# Patient Record
Sex: Male | Born: 1995 | Race: White | Hispanic: No | Marital: Single | Smoking: Never smoker
Health system: Southern US, Community
[De-identification: ages and names within clinical notes are randomized; demographics above are authoritative.]

## PROBLEM LIST (undated history)

## (undated) DIAGNOSIS — R278 Other lack of coordination: Secondary | ICD-10-CM

## (undated) DIAGNOSIS — F909 Attention-deficit hyperactivity disorder, unspecified type: Secondary | ICD-10-CM

## (undated) DIAGNOSIS — J309 Allergic rhinitis, unspecified: Secondary | ICD-10-CM

## (undated) DIAGNOSIS — H9325 Central auditory processing disorder: Secondary | ICD-10-CM

## (undated) DIAGNOSIS — F329 Major depressive disorder, single episode, unspecified: Secondary | ICD-10-CM

## (undated) DIAGNOSIS — F32A Depression, unspecified: Secondary | ICD-10-CM

## (undated) DIAGNOSIS — R7303 Prediabetes: Secondary | ICD-10-CM

## (undated) HISTORY — DX: Central auditory processing disorder: H93.25

## (undated) HISTORY — DX: Other lack of coordination: R27.8

## (undated) HISTORY — DX: Attention-deficit hyperactivity disorder, unspecified type: F90.9

## (undated) HISTORY — DX: Morbid (severe) obesity due to excess calories: E66.01

## (undated) HISTORY — DX: Major depressive disorder, single episode, unspecified: F32.9

## (undated) HISTORY — PX: NO PAST SURGERIES: SHX2092

## (undated) HISTORY — DX: Depression, unspecified: F32.A

## (undated) HISTORY — DX: Allergic rhinitis, unspecified: J30.9

## (undated) HISTORY — DX: Prediabetes: R73.03

## (undated) HISTORY — PX: WISDOM TOOTH EXTRACTION: SHX21

---
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
## 2015-03-07 ENCOUNTER — Ambulatory Visit (INDEPENDENT_AMBULATORY_CARE_PROVIDER_SITE_OTHER): Payer: BC Managed Care – PPO

## 2015-03-07 DIAGNOSIS — Z23 Encounter for immunization: Secondary | ICD-10-CM | POA: Diagnosis not present

## 2015-03-10 ENCOUNTER — Ambulatory Visit: Payer: BC Managed Care – PPO

## 2015-04-11 ENCOUNTER — Ambulatory Visit (INDEPENDENT_AMBULATORY_CARE_PROVIDER_SITE_OTHER): Payer: BC Managed Care – PPO | Admitting: Internal Medicine

## 2015-04-11 ENCOUNTER — Encounter: Payer: Self-pay | Admitting: Internal Medicine

## 2015-04-11 VITALS — BP 119/84 | HR 74 | Temp 98.6°F | Resp 16 | Ht 74.0 in | Wt 257.0 lb

## 2015-04-11 DIAGNOSIS — J302 Other seasonal allergic rhinitis: Secondary | ICD-10-CM | POA: Diagnosis not present

## 2015-04-11 DIAGNOSIS — H6980 Other specified disorders of Eustachian tube, unspecified ear: Secondary | ICD-10-CM | POA: Insufficient documentation

## 2015-04-11 DIAGNOSIS — H6982 Other specified disorders of Eustachian tube, left ear: Secondary | ICD-10-CM | POA: Diagnosis not present

## 2015-04-11 MED ORDER — METHYLPREDNISOLONE 4 MG PO TBPK: ORAL_TABLET | ORAL | Status: DC

## 2015-04-11 MED ORDER — MOMETASONE FUROATE 50 MCG/ACT NA SUSP: 4.0000 | Freq: Every day | NASAL | Status: DC

## 2015-04-11 MED ORDER — CETIRIZINE-PSEUDOEPHEDRINE ER 5-120 MG PO TB12: 1.0000 | ORAL_TABLET | Freq: Two times a day (BID) | ORAL | Status: DC

## 2015-04-11 NOTE — Patient Instructions (Signed)

## 2015-04-11 NOTE — Progress Notes (Signed)
Subjective:  Patient ID: Ethan Cruz, male    DOB: 07-28-1995  Age: 19 y.o. MRN: [id number]  CC: Allergic Rhinitis    HPI Ethan Cruz presents for one-week history of upper respiratory symptoms. He complains of runny nose, sneezing, and a popping sensation in his left ear with a few episodes of dizziness. He has been using Alka-Seltzer without much relief from his symptoms.  Outpatient Prescriptions Prior to Visit  Medication Sig Dispense Refill  . escitalopram (LEXAPRO) 20 MG tablet Take 1 tablet (20 mg total) by mouth daily. 90 tablet 3  . Fluticasone Propionate (FLONASE ALLERGY RELIEF NA) Place into the nose daily.    . Loratadine-Pseudoephedrine (CLARITIN-D 24 HOUR PO) Take by mouth daily.    . nortriptyline (PAMELOR) 10 MG capsule Take 1 capsule (10 mg total) by mouth at bedtime. 30 capsule 5  . SUMAtriptan-naproxen (TREXIMET) 85-500 MG per tablet Take 1 tablet by mouth every 2 (two) hours as needed for migraine. 10 tablet 3   No facility-administered medications prior to visit.    ROS Review of Systems  Constitutional: Negative.  Negative for chills, diaphoresis, activity change, appetite change, fatigue and unexpected weight change.  HENT: Positive for congestion, postnasal drip, rhinorrhea and sneezing. Negative for nosebleeds, sinus pressure, sore throat, trouble swallowing and voice change.   Eyes: Negative.   Respiratory: Negative.  Negative for cough, choking, chest tightness, shortness of breath and stridor.   Cardiovascular: Negative.  Negative for chest pain, palpitations and leg swelling.  Gastrointestinal: Negative.  Negative for abdominal pain.  Endocrine: Negative.   Genitourinary: Negative.   Musculoskeletal: Negative.   Skin: Negative.  Negative for rash.  Allergic/Immunologic: Negative.   Neurological: Positive for dizziness.  Hematological: Negative.   Psychiatric/Behavioral: Negative.     Objective:  BP 119/84 mmHg  Pulse 74  Temp(Src) 98.6 F (37  C) (Oral)  Resp 16  Ht 6\' 2"  (1.88 m)  Wt 257 lb (116.574 kg)  BMI 32.98 kg/m2  SpO2 97%  BP Readings from Last 3 Encounters:  04/11/15 119/84  11/01/14 118/70  05/19/14 124/80    Wt Readings from Last 3 Encounters:  04/11/15 257 lb (116.574 kg) (99 %*, Z = 2.52)  11/01/14 248 lb (112.492 kg) (99 %*, Z = 2.40)  05/19/14 247 lb (112.038 kg) (99 %*, Z = 2.40)   * Growth percentiles are based on CDC 2-20 Years data.    Physical Exam  Constitutional: He is oriented to person, place, and time.  Non-toxic appearance. He does not have a sickly appearance. He does not appear ill. No distress.  HENT:  Head: Normocephalic and atraumatic.  Right Ear: Hearing, tympanic membrane, external ear and ear canal normal.  Left Ear: Hearing, tympanic membrane, external ear and ear canal normal.  Nose: Mucosal edema and rhinorrhea present. No sinus tenderness. Right sinus exhibits no maxillary sinus tenderness and no frontal sinus tenderness. Left sinus exhibits no maxillary sinus tenderness and no frontal sinus tenderness.  Mouth/Throat: Oropharynx is clear and moist and mucous membranes are normal. Mucous membranes are not pale, not dry and not cyanotic. No oral lesions. No trismus in the jaw. No uvula swelling. No oropharyngeal exudate, posterior oropharyngeal edema, posterior oropharyngeal erythema or tonsillar abscesses.  Eyes: Conjunctivae are normal. Right eye exhibits no discharge. Left eye exhibits no discharge. No scleral icterus.  Neck: Normal range of motion. Neck supple. No JVD present. No tracheal deviation present. No thyromegaly present.  Cardiovascular: Normal rate, regular rhythm, normal heart  sounds and intact distal pulses.  Exam reveals no gallop and no friction rub.   No murmur heard. Pulmonary/Chest: Effort normal and breath sounds normal. No stridor. No respiratory distress. He has no wheezes. He has no rales. He exhibits no tenderness.  Abdominal: Soft. Bowel sounds are normal.  He exhibits no distension and no mass. There is no tenderness. There is no rebound and no guarding.  Musculoskeletal: Normal range of motion. He exhibits no edema or tenderness.  Lymphadenopathy:    He has no cervical adenopathy.  Neurological: He is oriented to person, place, and time.  Skin: Skin is warm and dry. No rash noted. He is not diaphoretic. No erythema. No pallor.  Vitals reviewed.   Lab Results  Component Value Date   WBC 5.8 11/01/2014   HGB 15.1 11/01/2014   HCT 44.2 11/01/2014   PLT 207.0 11/01/2014   GLUCOSE 94 11/01/2014   CHOL 164 05/19/2014   TRIG 113.0 05/19/2014   HDL 28.30* 05/19/2014   LDLDIRECT 87.9 11/30/2010   LDLCALC 113* 05/19/2014   ALT 40 11/01/2014   AST 23 11/01/2014   NA 141 11/01/2014   K 3.8 11/01/2014   CL 104 11/01/2014   CREATININE 0.98 11/01/2014   BUN 13 11/01/2014   CO2 30 11/01/2014   TSH 1.41 11/01/2014   HGBA1C 5.6 02/10/2013    Dg Chest 2 View  02/26/2011  *RADIOLOGY REPORT* Clinical Data: 2 weeks of persistent coughing.  Itching in the chest. CHEST - 2 VIEW Comparison: None. Findings: Cardiomediastinal silhouette is within normal limits. The lungs are free of focal consolidations and pleural effusions. Bony structures have a normal appearance. IMPRESSION: Negative exam. Original Report Authenticated By: Patterson Hammersmith, M.D.   Assessment & Plan:   Ethan Cruz was seen today for allergic rhinitis .  Diagnoses and all orders for this visit:  Other seasonal allergic rhinitis- he is having a flareup of the symptoms and has not been using the previously prescribed medications. Will treat this with a Medrol Dosepak and will restart a steroid nasal spray as well as an oral antihistamine decongestant combination. -     methylPREDNISolone (MEDROL DOSEPAK) 4 MG TBPK tablet; TAKE AS DIRECTED -     mometasone (NASONEX) 50 MCG/ACT nasal spray; Place 4 sprays into the nose daily. -     cetirizine-pseudoephedrine (ZYRTEC-D ALLERGY &  CONGESTION) 5-120 MG tablet; Take 1 tablet by mouth 2 (two) times daily.  Eustachian tube dysfunction, left- will treat this with a Medrol Dosepak and will start Nasonex nasal spray and antihistamines decongestants. -     methylPREDNISolone (MEDROL DOSEPAK) 4 MG TBPK tablet; TAKE AS DIRECTED -     mometasone (NASONEX) 50 MCG/ACT nasal spray; Place 4 sprays into the nose daily. -     cetirizine-pseudoephedrine (ZYRTEC-D ALLERGY & CONGESTION) 5-120 MG tablet; Take 1 tablet by mouth 2 (two) times daily.   I have discontinued Mr. Dunshee Loratadine-Pseudoephedrine (CLARITIN-D 24 HOUR PO), Fluticasone Propionate (FLONASE ALLERGY RELIEF NA), nortriptyline, and SUMAtriptan-naproxen. I am also having him start on methylPREDNISolone, mometasone, and cetirizine-pseudoephedrine. Additionally, I am having him maintain his escitalopram.  Meds ordered this encounter  Medications  . methylPREDNISolone (MEDROL DOSEPAK) 4 MG TBPK tablet    Sig: TAKE AS DIRECTED    Dispense:  21 tablet    Refill:  0  . mometasone (NASONEX) 50 MCG/ACT nasal spray    Sig: Place 4 sprays into the nose daily.    Dispense:  17 g    Refill:  12  . cetirizine-pseudoephedrine (ZYRTEC-D ALLERGY & CONGESTION) 5-120 MG tablet    Sig: Take 1 tablet by mouth 2 (two) times daily.    Dispense:  60 tablet    Refill:  11     Follow-up: Return in about 3 weeks (around 05/02/2015).  Sanda Linger, MD

## 2015-04-11 NOTE — Progress Notes (Signed)
Pre visit review using our clinic review tool, if applicable. No additional management support is needed unless otherwise documented below in the visit note. 

## 2015-07-11 ENCOUNTER — Ambulatory Visit (INDEPENDENT_AMBULATORY_CARE_PROVIDER_SITE_OTHER)
Admission: RE | Admit: 2015-07-11 | Discharge: 2015-07-11 | Disposition: A | Discharge status: Home / Self Care | Payer: BC Managed Care – PPO | Source: Ambulatory Visit | Attending: Internal Medicine | Admitting: Internal Medicine

## 2015-07-11 ENCOUNTER — Other Ambulatory Visit (INDEPENDENT_AMBULATORY_CARE_PROVIDER_SITE_OTHER): Payer: BC Managed Care – PPO

## 2015-07-11 ENCOUNTER — Encounter: Payer: Self-pay | Admitting: Internal Medicine

## 2015-07-11 ENCOUNTER — Ambulatory Visit (INDEPENDENT_AMBULATORY_CARE_PROVIDER_SITE_OTHER): Payer: BC Managed Care – PPO | Admitting: Internal Medicine

## 2015-07-11 VITALS — BP 112/82 | HR 78 | Temp 98.6°F | Resp 16 | Ht 74.0 in | Wt 265.0 lb

## 2015-07-11 DIAGNOSIS — M791 Myalgia: Secondary | ICD-10-CM

## 2015-07-11 DIAGNOSIS — M25561 Pain in right knee: Secondary | ICD-10-CM

## 2015-07-11 DIAGNOSIS — IMO0001 Reserved for inherently not codable concepts without codable children: Secondary | ICD-10-CM

## 2015-07-11 DIAGNOSIS — M609 Myositis, unspecified: Secondary | ICD-10-CM

## 2015-07-11 DIAGNOSIS — M25569 Pain in unspecified knee: Secondary | ICD-10-CM | POA: Insufficient documentation

## 2015-07-11 LAB — CBC WITH DIFFERENTIAL/PLATELET
Basophils Absolute: 0 10*3/uL (ref 0.0–0.1)
Basophils Relative: 0.4 % (ref 0.0–3.0)
EOS PCT: 3.8 % (ref 0.0–5.0)
Eosinophils Absolute: 0.4 10*3/uL (ref 0.0–0.7)
HCT: 48.5 % (ref 36.0–49.0)
HEMOGLOBIN: 16.2 g/dL — AB (ref 12.0–16.0)
Lymphocytes Relative: 26.2 % (ref 24.0–48.0)
Lymphs Abs: 2.9 10*3/uL (ref 0.7–4.0)
MCHC: 33.3 g/dL (ref 31.0–37.0)
MCV: 85.9 fl (ref 78.0–98.0)
MONOS PCT: 6.9 % (ref 3.0–12.0)
Monocytes Absolute: 0.8 10*3/uL (ref 0.1–1.0)
Neutro Abs: 6.8 10*3/uL (ref 1.4–7.7)
Neutrophils Relative %: 62.7 % (ref 43.0–71.0)
Platelets: 256 10*3/uL (ref 150.0–575.0)
RBC: 5.65 Mil/uL (ref 3.80–5.70)
RDW: 13.6 % (ref 11.4–15.5)
WBC: 10.9 10*3/uL (ref 4.5–13.5)

## 2015-07-11 LAB — COMPREHENSIVE METABOLIC PANEL
ALBUMIN: 4.8 g/dL (ref 3.5–5.2)
ALT: 44 U/L (ref 0–53)
AST: 20 U/L (ref 0–37)
Alkaline Phosphatase: 77 U/L (ref 52–171)
BUN: 20 mg/dL (ref 6–23)
CALCIUM: 9.3 mg/dL (ref 8.4–10.5)
CHLORIDE: 105 meq/L (ref 96–112)
CO2: 26 mEq/L (ref 19–32)
Creatinine, Ser: 1.22 mg/dL (ref 0.40–1.50)
GFR: 80.89 mL/min (ref >60.00)
Glucose, Bld: 100 mg/dL — ABNORMAL HIGH (ref 70–99)
POTASSIUM: 3.9 meq/L (ref 3.5–5.1)
SODIUM: 140 meq/L (ref 135–145)
Total Bilirubin: 0.4 mg/dL (ref 0.2–1.2)
Total Protein: 7.6 g/dL (ref 6.0–8.3)

## 2015-07-11 LAB — RHEUMATOID FACTOR: Rhuematoid fact SerPl-aCnc: <10 IU/mL (ref <=14)

## 2015-07-11 LAB — CK: CK TOTAL: 88 U/L (ref 7–232)

## 2015-07-11 NOTE — Patient Instructions (Signed)
Muscle Pain, Adult  Muscle pain (myalgia) may be caused by many things, including:  · Overuse or muscle strain, especially if you are not in shape. This is the most common cause of muscle pain.  · Injury.  · Bruises.  · Viruses, such as the flu.  · Infectious diseases.  · Fibromyalgia, which is a chronic condition that causes muscle tenderness, fatigue, and headache.  · Autoimmune diseases, including lupus.  · Certain drugs, including ACE inhibitors and statins.  Muscle pain may be mild or severe. In most cases, the pain lasts only a short time and goes away without treatment. To diagnose the cause of your muscle pain, your health care provider will take your medical history. This means he or she will ask you when your muscle pain began and what has been happening. If you have not had muscle pain for very long, your health care provider may want to wait before doing much testing. If your muscle pain has lasted a long time, your health care provider may want to run tests right away. If your health care provider thinks your muscle pain may be caused by illness, you may need to have additional tests to rule out certain conditions.   Treatment for muscle pain depends on the cause. Home care is often enough to relieve muscle pain. Your health care provider may also prescribe anti-inflammatory medicine.  HOME CARE INSTRUCTIONS  Watch your condition for any changes. The following actions may help to lessen any discomfort you are feeling:  · Only take over-the-counter or prescription medicines as directed by your health care provider.  · Apply ice to the sore muscle:    Put ice in a plastic bag.    Place a towel between your skin and the bag.    Leave the ice on for 15-20 minutes, 3-4 times a day.  · You may alternate applying hot and cold packs to the muscle as directed by your health care provider.  · If overuse is causing your muscle pain, slow down your activities until the pain goes away.    Remember that it is normal  to feel some muscle pain after starting a workout program. Muscles that have not been used often will be sore at first.    Do regular, gentle exercises if you are not usually active.    Warm up before exercising to lower your risk of muscle pain.  · Do not continue working out if the pain is very bad. Bad pain could mean you have injured a muscle.  SEEK MEDICAL CARE IF:  · Your muscle pain gets worse, and medicines do not help.  · You have muscle pain that lasts longer than 3 days.  · You have a rash or fever along with muscle pain.  · You have muscle pain after a tick bite.  · You have muscle pain while working out, even though you are in good physical condition.  · You have redness, soreness, or swelling along with muscle pain.  · You have muscle pain after starting a new medicine or changing the dose of a medicine.  SEEK IMMEDIATE MEDICAL CARE IF:  · You have trouble breathing.  · You have trouble swallowing.  · You have muscle pain along with a stiff neck, fever, and vomiting.  · You have severe muscle weakness or cannot move part of your body.  MAKE SURE YOU:   · Understand these instructions.  · Will watch your condition.  · Will get   help right away if you are not doing well or get worse.     This information is not intended to replace advice given to you by your health care provider. Make sure you discuss any questions you have with your health care provider.     Document Released: 04/18/2006 Document Revised: 06/17/2014 Document Reviewed: 03/23/2013  [employer] Interactive Patient Education ©2016 [employer]

## 2015-07-12 ENCOUNTER — Encounter: Payer: Self-pay | Admitting: Internal Medicine

## 2015-07-12 LAB — CYCLIC CITRUL PEPTIDE ANTIBODY, IGG

## 2015-07-12 LAB — ANA: Anti Nuclear Antibody(ANA): NEGATIVE

## 2015-07-12 LAB — SEDIMENTATION RATE: SED RATE: 4 mm/h (ref 0–22)

## 2015-07-13 NOTE — Progress Notes (Signed)
Subjective:  Patient ID: Ethan Cruz, male    DOB: [birth year]  Age: 20 y.o. MRN: [id number]  CC: Knee Pain and Muscle Pain   HPI Ethan Cruz presents for the complaint of burning and aching in his muscles and large joints for about a week or 2. The symptoms are symmetrical. He also complains of a mild headache but denies nausea or vomiting. He has not taken anything for the discomfort. The symptoms are most severe in his right knee.  Outpatient Prescriptions Prior to Visit  Medication Sig Dispense Refill  . cetirizine-pseudoephedrine (ZYRTEC-D ALLERGY & CONGESTION) 5-120 MG tablet Take 1 tablet by mouth 2 (two) times daily. 60 tablet 11  . escitalopram (LEXAPRO) 20 MG tablet Take 1 tablet (20 mg total) by mouth daily. 90 tablet 3  . mometasone (NASONEX) 50 MCG/ACT nasal spray Place 4 sprays into the nose daily. 17 g 12  . methylPREDNISolone (MEDROL DOSEPAK) 4 MG TBPK tablet TAKE AS DIRECTED 21 tablet 0   No facility-administered medications prior to visit.    ROS Review of Systems  Constitutional: Negative.  Negative for fever, chills, diaphoresis, appetite change and fatigue.  HENT: Negative for facial swelling and sore throat.   Eyes: Negative.   Respiratory: Negative.  Negative for cough, choking, chest tightness, shortness of breath and stridor.   Cardiovascular: Negative.  Negative for chest pain, palpitations and leg swelling.  Gastrointestinal: Negative.  Negative for nausea, vomiting, abdominal pain, diarrhea, constipation and blood in stool.  Endocrine: Negative.   Genitourinary: Negative.  Negative for dysuria, urgency, hematuria, difficulty urinating and genital sores.  Musculoskeletal: Positive for myalgias and arthralgias. Negative for back pain, joint swelling, neck pain and neck stiffness.  Skin: Negative.  Negative for color change, pallor and rash.  Allergic/Immunologic: Negative.   Neurological: Positive for headaches. Negative for dizziness, tremors, weakness  and numbness.  Hematological: Negative.  Negative for adenopathy. Does not bruise/bleed easily.  Psychiatric/Behavioral: Negative.     Objective:  BP 112/82 mmHg  Pulse 78  Temp(Src) 98.6 F (37 C) (Oral)  Resp 16  Ht  (1.88 m)  Wt 265 lb (120.203 kg)  BMI 34.01 kg/m2  SpO2 97%  BP Readings from Last 3 Encounters:  07/11/15 112/82  04/11/15 119/84  11/01/14 118/70    Wt Readings from Last 3 Encounters:  07/11/15 265 lb (120.203 kg) (100 %*, Z = 2.63)  04/11/15 257 lb (116.574 kg) (99 %*, Z = 2.52)  11/01/14 248 lb (112.492 kg) (99 %*, Z = 2.40)   * Growth percentiles are based on CDC 2-20 Years data.    Physical Exam  Constitutional: He is oriented to person, place, and time. He appears well-developed and well-nourished.  Non-toxic appearance. He does not have a sickly appearance. He does not appear ill. No distress.  HENT:  Head: Normocephalic and atraumatic.  Mouth/Throat: Oropharynx is clear and moist. No oropharyngeal exudate.  Eyes: Conjunctivae are normal. Right eye exhibits no discharge. Left eye exhibits no discharge. No scleral icterus.  Neck: Normal range of motion. Neck supple. No JVD present. No tracheal deviation present. No thyromegaly present.  Cardiovascular: Normal rate, regular rhythm, normal heart sounds and intact distal pulses.  Exam reveals no gallop and no friction rub.   No murmur heard. Pulmonary/Chest: Effort normal and breath sounds normal. No stridor. No respiratory distress. He has no wheezes. He has no rales. He exhibits no tenderness.  Abdominal: Soft. Bowel sounds are normal. He exhibits no distension and no  mass. There is no tenderness. There is no rebound and no guarding.  Musculoskeletal: Normal range of motion. He exhibits no edema or tenderness.       Right knee: He exhibits deformity. He exhibits normal range of motion, no swelling, no effusion, no ecchymosis and no erythema. No tenderness found.       Legs: All of his joints show  free range of motion with no tenderness to palpation, erythema, swelling, or effusions.  Lymphadenopathy:    He has no cervical adenopathy.  Neurological: He is oriented to person, place, and time.  Skin: Skin is warm and dry. No rash noted. He is not diaphoretic. No erythema. No pallor.  Psychiatric: He has a normal mood and affect. His behavior is normal. Judgment and thought content normal.  Vitals reviewed.   Lab Results  Component Value Date   WBC 10.9 07/11/2015   HGB 16.2* 07/11/2015   HCT 48.5 07/11/2015   PLT 256.0 07/11/2015   GLUCOSE 100* 07/11/2015   CHOL 164 05/19/2014   TRIG 113.0 05/19/2014   HDL 28.30* 05/19/2014   LDLDIRECT 87.9 11/30/2010   LDLCALC 113* 05/19/2014   ALT 44 07/11/2015   AST 20 07/11/2015   NA 140 07/11/2015   K 3.9 07/11/2015   CL 105 07/11/2015   CREATININE 1.22 07/11/2015   BUN 20 07/11/2015   CO2 26 07/11/2015   TSH 1.41 11/01/2014   HGBA1C 5.6 02/10/2013    Dg Knee Complete 4 Views Right  07/12/2015  CLINICAL DATA:  Right knee nodule.  Pain.  No known injury. EXAM: RIGHT KNEE - COMPLETE 4+ VIEW COMPARISON:  No prior . FINDINGS: No acute bony or joint abnormality identified. No evidence of fracture dislocation. Soft tissues are unremarkable. If symptoms persist MRI can be obtained. IMPRESSION: Negative exam. Electronically Signed   By: Maisie Fus  Register   On: 07/12/2015 07:55    Assessment & Plan:   Beckham was seen today for knee pain and muscle pain.  Diagnoses and all orders for this visit:  Acute knee pain, right- exam and x-ray are normal. -     DG Knee Complete 4 Views Right; Future  Myalgia and myositis- his exam is normal, all of his labs are within normal limits, there is no evidence of inflammation, rheumatic disease, infection, anemia, or abnormal electrolytes. This appears to be a viral syndrome. He will start taking over-the-counter doses of Advil for symptom relief. -     Comprehensive metabolic panel; Future -     CK;  Future -     CBC with Differential/Platelet; Future -     Sedimentation rate; Future -     ANA; Future -     Cyclic citrul peptide antibody, IgG; Future -     Rheumatoid factor; Future   I have discontinued Mr. Vane's methylPREDNISolone. I am also having him maintain his escitalopram, mometasone, and cetirizine-pseudoephedrine.  No orders of the defined types were placed in this encounter.     Follow-up: Return in about 4 weeks (around 08/08/2015).  Sanda Linger, MD

## 2015-09-05 ENCOUNTER — Encounter: Payer: Self-pay | Admitting: Family Medicine

## 2015-09-05 ENCOUNTER — Ambulatory Visit (INDEPENDENT_AMBULATORY_CARE_PROVIDER_SITE_OTHER): Payer: BC Managed Care – PPO | Admitting: Family Medicine

## 2015-09-05 VITALS — BP 130/80 | Temp 98.9°F | Wt 267.0 lb

## 2015-09-05 DIAGNOSIS — J069 Acute upper respiratory infection, unspecified: Secondary | ICD-10-CM | POA: Diagnosis not present

## 2015-09-05 DIAGNOSIS — B9789 Other viral agents as the cause of diseases classified elsewhere: Principal | ICD-10-CM

## 2015-09-05 MED ORDER — HYDROCODONE-HOMATROPINE 5-1.5 MG/5ML PO SYRP: ORAL_SOLUTION | ORAL | Status: DC

## 2015-09-05 NOTE — Patient Instructions (Signed)
Drink lots of liquids  Tylenol or aspirin for general: Symptoms  Hydromet 1/2-1 teaspoon at bedtime when necessary for cough  See Dr. Yetta BarreJones if you have any concerns

## 2015-09-05 NOTE — Progress Notes (Signed)
Pre visit review using our clinic review tool, if applicable. No additional management support is needed unless otherwise documented below in the visit note. 

## 2015-09-05 NOTE — Progress Notes (Signed)
   Subjective:    Patient ID: Ethan Cruz, male    DOB: 1996/02/26, 20 y.o.   MRN: [id number]  HPI Ethan Cruz is a 20 year old single male nonsmoker who comes in today for evaluation of a cold for one day!!!!!!!!!!!!!!!!!!!!!!  He's had head congestion runny nose and slight cough 1 day no fever chills etc. etc. he called his primary care physician Dr. Yetta BarreJones however they declined to see him.   Review of Systems    review of systems negative Objective:   Physical Exam  Well-developed well-nourished male no acute distress vital signs stable he is afebrile HEENT were negative neck was supple no adenopathy lungs are clear      Assessment & Plan:  Viral syndrome treat symptomatically with Tylenol lots of liquids and Hydromet cough syrup when necessary for nighttime cough

## 2015-09-07 ENCOUNTER — Encounter: Payer: Self-pay | Admitting: Internal Medicine

## 2015-09-07 ENCOUNTER — Ambulatory Visit (INDEPENDENT_AMBULATORY_CARE_PROVIDER_SITE_OTHER)
Admission: RE | Admit: 2015-09-07 | Discharge: 2015-09-07 | Disposition: A | Discharge status: Home / Self Care | Payer: BC Managed Care – PPO | Source: Ambulatory Visit | Attending: Internal Medicine | Admitting: Internal Medicine

## 2015-09-07 ENCOUNTER — Ambulatory Visit (INDEPENDENT_AMBULATORY_CARE_PROVIDER_SITE_OTHER): Payer: BC Managed Care – PPO | Admitting: Internal Medicine

## 2015-09-07 VITALS — BP 132/102 | HR 76 | Temp 98.2°F | Resp 16 | Ht 74.02 in | Wt 267.0 lb

## 2015-09-07 DIAGNOSIS — I10 Essential (primary) hypertension: Secondary | ICD-10-CM | POA: Insufficient documentation

## 2015-09-07 DIAGNOSIS — J069 Acute upper respiratory infection, unspecified: Secondary | ICD-10-CM | POA: Diagnosis not present

## 2015-09-07 DIAGNOSIS — R05 Cough: Secondary | ICD-10-CM

## 2015-09-07 DIAGNOSIS — R059 Cough, unspecified: Secondary | ICD-10-CM

## 2015-09-07 DIAGNOSIS — B9789 Other viral agents as the cause of diseases classified elsewhere: Principal | ICD-10-CM

## 2015-09-07 MED ORDER — METHYLPREDNISOLONE 4 MG PO TBPK: ORAL_TABLET | ORAL | Status: DC

## 2015-09-07 MED ORDER — TELMISARTAN 40 MG PO TABS: 40.0000 mg | ORAL_TABLET | Freq: Every day | ORAL | Status: DC

## 2015-09-07 NOTE — Progress Notes (Signed)
Pre visit review using our clinic review tool, if applicable. No additional management support is needed unless otherwise documented below in the visit note. 

## 2015-09-07 NOTE — Progress Notes (Signed)
Subjective:  Patient ID: Ethan Cruz, male    DOB: Nov 15, 1995  Age: 20 y.o. MRN: [id number]  CC: Hypertension and Cough   HPI Ethan Cruz presents for a 3 day history of cough productive of clear mucus, sore throat, earache, and chest pain with coughing.  Outpatient Prescriptions Prior to Visit  Medication Sig Dispense Refill  . escitalopram (LEXAPRO) 20 MG tablet Take 1 tablet (20 mg total) by mouth daily. 90 tablet 3  . HYDROcodone-homatropine (HYCODAN) 5-1.5 MG/5ML syrup 1/2-1 teaspoon at bedtime when necessary for cough 120 mL 0  . mometasone (NASONEX) 50 MCG/ACT nasal spray Place 4 sprays into the nose daily. 17 g 12  . cetirizine-pseudoephedrine (ZYRTEC-D ALLERGY & CONGESTION) 5-120 MG tablet Take 1 tablet by mouth 2 (two) times daily. 60 tablet 11   No facility-administered medications prior to visit.    ROS Review of Systems  Constitutional: Negative.  Negative for fever, chills, diaphoresis, appetite change and fatigue.  HENT: Positive for ear pain and sore throat. Negative for congestion, rhinorrhea, sinus pressure, trouble swallowing and voice change.   Eyes: Negative.   Respiratory: Positive for cough. Negative for choking, chest tightness, shortness of breath, wheezing and stridor.   Cardiovascular: Positive for chest pain. Negative for palpitations and leg swelling.  Gastrointestinal: Negative.  Negative for nausea, vomiting, abdominal pain, diarrhea, constipation and blood in stool.  Endocrine: Negative.   Genitourinary: Negative.   Musculoskeletal: Negative.  Negative for myalgias, back pain, joint swelling, arthralgias and neck pain.  Skin: Negative.   Allergic/Immunologic: Negative.   Neurological: Negative.   Hematological: Negative.  Negative for adenopathy. Does not bruise/bleed easily.  Psychiatric/Behavioral: Negative.     Objective:  BP 132/102 mmHg  Pulse 76  Temp(Src) 98.2 F (36.8 C) (Oral)  Resp 16  Ht 6' 2.02" (1.88 m)  Wt 267 lb (121.11  kg)  BMI 34.27 kg/m2  BP Readings from Last 3 Encounters:  09/07/15 132/102  09/05/15 130/80  07/11/15 112/82    Wt Readings from Last 3 Encounters:  09/07/15 267 lb (121.11 kg) (100 %*, Z = 2.66)  09/05/15 267 lb (121.11 kg) (100 %*, Z = 2.66)  07/11/15 265 lb (120.203 kg) (100 %*, Z = 2.63)   * Growth percentiles are based on CDC 2-20 Years data.    Physical Exam  Constitutional: He is oriented to person, place, and time. He appears well-developed and well-nourished.  Non-toxic appearance. He does not have a sickly appearance. He does not appear ill. No distress.  HENT:  Head: Normocephalic and atraumatic.  Mouth/Throat: Oropharynx is clear and moist. No oropharyngeal exudate.  Eyes: Conjunctivae are normal. Right eye exhibits no discharge. Left eye exhibits no discharge. No scleral icterus.  Neck: Normal range of motion. Neck supple. No JVD present. No tracheal deviation present. No thyromegaly present.  Cardiovascular: Normal rate, regular rhythm, normal heart sounds and intact distal pulses.  Exam reveals no gallop and no friction rub.   No murmur heard. EKG ---  Sinus  Rhythm  -With rate variation  cv = 11. WITHIN NORMAL LIMITS  Pulmonary/Chest: Effort normal and breath sounds normal. No stridor. No respiratory distress. He has no wheezes. He has no rales. He exhibits no tenderness.  Abdominal: Soft. Bowel sounds are normal. He exhibits no distension and no mass. There is no tenderness. There is no rebound and no guarding.  Musculoskeletal: Normal range of motion. He exhibits no edema or tenderness.  Lymphadenopathy:    He has no cervical  adenopathy.  Neurological: He is oriented to person, place, and time.  Skin: Skin is warm and dry. No rash noted. He is not diaphoretic. No erythema. No pallor.  Vitals reviewed.   Lab Results  Component Value Date   WBC 10.9 07/11/2015   HGB 16.2* 07/11/2015   HCT 48.5 07/11/2015   PLT 256.0 07/11/2015   GLUCOSE 100* 07/11/2015     CHOL 164 05/19/2014   TRIG 113.0 05/19/2014   HDL 28.30* 05/19/2014   LDLDIRECT 87.9 11/30/2010   LDLCALC 113* 05/19/2014   ALT 44 07/11/2015   AST 20 07/11/2015   NA 140 07/11/2015   K 3.9 07/11/2015   CL 105 07/11/2015   CREATININE 1.22 07/11/2015   BUN 20 07/11/2015   CO2 26 07/11/2015   TSH 1.41 11/01/2014   HGBA1C 5.6 02/10/2013    Dg Knee Complete 4 Views Right  07/12/2015  CLINICAL DATA:  Right knee nodule.  Pain.  No known injury. EXAM: RIGHT KNEE - COMPLETE 4+ VIEW COMPARISON:  No prior . FINDINGS: No acute bony or joint abnormality identified. No evidence of fracture dislocation. Soft tissues are unremarkable. If symptoms persist MRI can be obtained. IMPRESSION: Negative exam. Electronically Signed   By: Maisie Fus  Register   On: 07/12/2015 07:55   Dg Chest 2 View  09/07/2015  CLINICAL DATA:  Cough and shortness of breath for 4 days. EXAM: CHEST  2 VIEW COMPARISON:  02/26/2011 FINDINGS: The heart size and mediastinal contours are within normal limits. Both lungs are clear. The visualized skeletal structures are unremarkable. IMPRESSION: Negative.  No active cardiopulmonary disease. Electronically Signed   By: Myles Rosenthal M.D.   On: 09/07/2015 14:13    Assessment & Plan:   Ethan Cruz was seen today for hypertension and cough.  Diagnoses and all orders for this visit:  Viral URI with cough- he will continue to cough suppressant and will also take a course of steroids for symptom relief. -     methylPREDNISolone (MEDROL DOSEPAK) 4 MG TBPK tablet; TAKE AS DIRECTED  Essential hypertension- his blood pressure is symmetrically elevated in both arms so I don't think he has coarctation of the aorta, also his EKG is normal today. He has a strong family history of hypertension in his recent labs do not show any secondary causes for hypertension. Will treat him with an ARB. -     EKG 12-Lead -     telmisartan (MICARDIS) 40 MG tablet; Take 1 tablet (40 mg total) by mouth daily.  Cough- his  chest x-ray is normal -     DG Chest 2 View; Future  Essential hypertension -     EKG 12-Lead -     telmisartan (MICARDIS) 40 MG tablet; Take 1 tablet (40 mg total) by mouth daily.  I have discontinued Mr. Ethan Cruz cetirizine-pseudoephedrine. I am also having him start on methylPREDNISolone and telmisartan. Additionally, I am having him maintain his escitalopram, mometasone, and HYDROcodone-homatropine.  Meds ordered this encounter  Medications  . methylPREDNISolone (MEDROL DOSEPAK) 4 MG TBPK tablet    Sig: TAKE AS DIRECTED    Dispense:  21 tablet    Refill:  0  . telmisartan (MICARDIS) 40 MG tablet    Sig: Take 1 tablet (40 mg total) by mouth daily.    Dispense:  30 tablet    Refill:  3     Follow-up: Return in about 3 weeks (around 09/28/2015).  Sanda Linger, MD

## 2015-09-07 NOTE — Patient Instructions (Signed)
Hypertension Hypertension, commonly called high blood pressure, is when the force of blood pumping through your arteries is too strong. Your arteries are the blood vessels that carry blood from your heart throughout your body. A blood pressure reading consists of a higher number over a lower number, such as 110/72. The higher number (systolic) is the pressure inside your arteries when your heart pumps. The lower number (diastolic) is the pressure inside your arteries when your heart relaxes. Ideally you want your blood pressure below 120/80. Hypertension forces your heart to work harder to pump blood. Your arteries may become narrow or stiff. Having untreated or uncontrolled hypertension can cause heart attack, stroke, kidney disease, and other problems. RISK FACTORS Some risk factors for high blood pressure are controllable. Others are not.  Risk factors you cannot control include:   Race. You may be at higher risk if you are African American.  Age. Risk increases with age.  Gender. Men are at higher risk than women before age 45 years. After age 65, women are at higher risk than men. Risk factors you can control include:  Not getting enough exercise or physical activity.  Being overweight.  Getting too much fat, sugar, calories, or salt in your diet.  Drinking too much alcohol. SIGNS AND SYMPTOMS Hypertension does not usually cause signs or symptoms. Extremely high blood pressure (hypertensive crisis) may cause headache, anxiety, shortness of breath, and nosebleed. DIAGNOSIS To check if you have hypertension, your health care provider will measure your blood pressure while you are seated, with your arm held at the level of your heart. It should be measured at least twice using the same arm. Certain conditions can cause a difference in blood pressure between your right and left arms. A blood pressure reading that is higher than normal on one occasion does not mean that you need treatment. If  it is not clear whether you have high blood pressure, you may be asked to return on a different day to have your blood pressure checked again. Or, you may be asked to monitor your blood pressure at home for 1 or more weeks. TREATMENT Treating high blood pressure includes making lifestyle changes and possibly taking medicine. Living a healthy lifestyle can help lower high blood pressure. You may need to change some of your habits. Lifestyle changes may include:  Following the DASH diet. This diet is high in fruits, vegetables, and whole grains. It is low in salt, red meat, and added sugars.  Keep your sodium intake below 2,300 mg per day.  Getting at least 30-45 minutes of aerobic exercise at least 4 times per week.  Losing weight if necessary.  Not smoking.  Limiting alcoholic beverages.  Learning ways to reduce stress. Your health care provider may prescribe medicine if lifestyle changes are not enough to get your blood pressure under control, and if one of the following is true:  You are 18-59 years of age and your systolic blood pressure is above 140.  You are 60 years of age or older, and your systolic blood pressure is above 150.  Your diastolic blood pressure is above 90.  You have diabetes, and your systolic blood pressure is over 140 or your diastolic blood pressure is over 90.  You have kidney disease and your blood pressure is above 140/90.  You have heart disease and your blood pressure is above 140/90. Your personal target blood pressure may vary depending on your medical conditions, your age, and other factors. HOME CARE INSTRUCTIONS    Have your blood pressure rechecked as directed by your health care provider.   Take medicines only as directed by your health care provider. Follow the directions carefully. Blood pressure medicines must be taken as prescribed. The medicine does not work as well when you skip doses. Skipping doses also puts you at risk for  problems.  Do not smoke.   Monitor your blood pressure at home as directed by your health care provider. SEEK MEDICAL CARE IF:   You think you are having a reaction to medicines taken.  You have recurrent headaches or feel dizzy.  You have swelling in your ankles.  You have trouble with your vision. SEEK IMMEDIATE MEDICAL CARE IF:  You develop a severe headache or confusion.  You have unusual weakness, numbness, or feel faint.  You have severe chest or abdominal pain.  You vomit repeatedly.  You have trouble breathing. MAKE SURE YOU:   Understand these instructions.  Will watch your condition.  Will get help right away if you are not doing well or get worse.   This information is not intended to replace advice given to you by your health care provider. Make sure you discuss any questions you have with your health care provider.   Document Released: 05/27/2005 Document Revised: 10/11/2014 Document Reviewed: 03/19/2013 Elsevier Interactive Patient Education 2016 [employer]  

## 2015-09-28 ENCOUNTER — Encounter: Payer: Self-pay | Admitting: Internal Medicine

## 2015-09-28 ENCOUNTER — Ambulatory Visit (INDEPENDENT_AMBULATORY_CARE_PROVIDER_SITE_OTHER): Payer: BC Managed Care – PPO | Admitting: Internal Medicine

## 2015-09-28 VITALS — BP 122/82 | HR 93 | Temp 98.1°F | Resp 16 | Ht 74.03 in | Wt 252.0 lb

## 2015-09-28 DIAGNOSIS — I10 Essential (primary) hypertension: Secondary | ICD-10-CM | POA: Diagnosis not present

## 2015-09-28 NOTE — Patient Instructions (Signed)
Hypertension Hypertension, commonly called high blood pressure, is when the force of blood pumping through your arteries is too strong. Your arteries are the blood vessels that carry blood from your heart throughout your body. A blood pressure reading consists of a higher number over a lower number, such as 110/72. The higher number (systolic) is the pressure inside your arteries when your heart pumps. The lower number (diastolic) is the pressure inside your arteries when your heart relaxes. Ideally you want your blood pressure below 120/80. Hypertension forces your heart to work harder to pump blood. Your arteries may become narrow or stiff. Having untreated or uncontrolled hypertension can cause heart attack, stroke, kidney disease, and other problems. RISK FACTORS Some risk factors for high blood pressure are controllable. Others are not.  Risk factors you cannot control include:   Race. You may be at higher risk if you are African American.  Age. Risk increases with age.  Gender. Men are at higher risk than women before age 45 years. After age 65, women are at higher risk than men. Risk factors you can control include:  Not getting enough exercise or physical activity.  Being overweight.  Getting too much fat, sugar, calories, or salt in your diet.  Drinking too much alcohol. SIGNS AND SYMPTOMS Hypertension does not usually cause signs or symptoms. Extremely high blood pressure (hypertensive crisis) may cause headache, anxiety, shortness of breath, and nosebleed. DIAGNOSIS To check if you have hypertension, your health care provider will measure your blood pressure while you are seated, with your arm held at the level of your heart. It should be measured at least twice using the same arm. Certain conditions can cause a difference in blood pressure between your right and left arms. A blood pressure reading that is higher than normal on one occasion does not mean that you need treatment. If  it is not clear whether you have high blood pressure, you may be asked to return on a different day to have your blood pressure checked again. Or, you may be asked to monitor your blood pressure at home for 1 or more weeks. TREATMENT Treating high blood pressure includes making lifestyle changes and possibly taking medicine. Living a healthy lifestyle can help lower high blood pressure. You may need to change some of your habits. Lifestyle changes may include:  Following the DASH diet. This diet is high in fruits, vegetables, and whole grains. It is low in salt, red meat, and added sugars.  Keep your sodium intake below 2,300 mg per day.  Getting at least 30-45 minutes of aerobic exercise at least 4 times per week.  Losing weight if necessary.  Not smoking.  Limiting alcoholic beverages.  Learning ways to reduce stress. Your health care provider may prescribe medicine if lifestyle changes are not enough to get your blood pressure under control, and if one of the following is true:  You are 18-59 years of age and your systolic blood pressure is above 140.  You are 60 years of age or older, and your systolic blood pressure is above 150.  Your diastolic blood pressure is above 90.  You have diabetes, and your systolic blood pressure is over 140 or your diastolic blood pressure is over 90.  You have kidney disease and your blood pressure is above 140/90.  You have heart disease and your blood pressure is above 140/90. Your personal target blood pressure may vary depending on your medical conditions, your age, and other factors. HOME CARE INSTRUCTIONS    Have your blood pressure rechecked as directed by your health care provider.   Take medicines only as directed by your health care provider. Follow the directions carefully. Blood pressure medicines must be taken as prescribed. The medicine does not work as well when you skip doses. Skipping doses also puts you at risk for  problems.  Do not smoke.   Monitor your blood pressure at home as directed by your health care provider. SEEK MEDICAL CARE IF:   You think you are having a reaction to medicines taken.  You have recurrent headaches or feel dizzy.  You have swelling in your ankles.  You have trouble with your vision. SEEK IMMEDIATE MEDICAL CARE IF:  You develop a severe headache or confusion.  You have unusual weakness, numbness, or feel faint.  You have severe chest or abdominal pain.  You vomit repeatedly.  You have trouble breathing. MAKE SURE YOU:   Understand these instructions.  Will watch your condition.  Will get help right away if you are not doing well or get worse.   This information is not intended to replace advice given to you by your health care provider. Make sure you discuss any questions you have with your health care provider.   Document Released: 05/27/2005 Document Revised: 10/11/2014 Document Reviewed: 03/19/2013 Elsevier Interactive Patient Education 2016 [employer]  

## 2015-09-28 NOTE — Progress Notes (Signed)
Pre visit review using our clinic review tool, if applicable. No additional management support is needed unless otherwise documented below in the visit note. 

## 2015-09-30 NOTE — Progress Notes (Signed)
Subjective:  Patient ID: Ethan Cruz, male    DOB: [birth year]  Age: 20 y.o. MRN: [id number]  CC: Hypertension   HPI Ethan Cruz presents for a blood pressure check. He feels well and offers no complaints and tells me his blood pressure has been well controlled. Since I last saw him he has lost weight with dietary restrictions and exercise.  Outpatient Prescriptions Prior to Visit  Medication Sig Dispense Refill  . escitalopram (LEXAPRO) 20 MG tablet Take 1 tablet (20 mg total) by mouth daily. 90 tablet 3  . mometasone (NASONEX) 50 MCG/ACT nasal spray Place 4 sprays into the nose daily. 17 g 12  . telmisartan (MICARDIS) 40 MG tablet Take 1 tablet (40 mg total) by mouth daily. 30 tablet 3  . HYDROcodone-homatropine (HYCODAN) 5-1.5 MG/5ML syrup 1/2-1 teaspoon at bedtime when necessary for cough 120 mL 0  . methylPREDNISolone (MEDROL DOSEPAK) 4 MG TBPK tablet TAKE AS DIRECTED 21 tablet 0   No facility-administered medications prior to visit.    ROS Review of Systems  Constitutional: Negative.   HENT: Negative.   Eyes: Negative.   Respiratory: Negative.  Negative for cough, choking, chest tightness, shortness of breath and stridor.   Cardiovascular: Negative.  Negative for chest pain, palpitations and leg swelling.  Gastrointestinal: Negative.  Negative for nausea, vomiting, abdominal pain, diarrhea and constipation.  Endocrine: Negative.   Genitourinary: Negative.  Negative for urgency, hematuria and difficulty urinating.  Musculoskeletal: Negative.   Skin: Negative.   Allergic/Immunologic: Negative.   Neurological: Negative.  Negative for dizziness, tremors, weakness and light-headedness.  Hematological: Negative.  Negative for adenopathy. Does not bruise/bleed easily.  Psychiatric/Behavioral: Negative.     Objective:  BP 122/82 mmHg  Pulse 93  Temp(Src) 98.1 F (36.7 C) (Oral)  Resp 16  Ht 6' 2.03" (1.88 m)  Wt 252 lb (114.306 kg)  BMI 32.34 kg/m2  SpO2 97%  BP  Readings from Last 3 Encounters:  09/28/15 122/82  09/07/15 132/102  09/05/15 130/80    Wt Readings from Last 3 Encounters:  09/28/15 252 lb (114.306 kg) (99 %*, Z = 2.43)  09/07/15 267 lb (121.11 kg) (100 %*, Z = 2.66)  09/05/15 267 lb (121.11 kg) (100 %*, Z = 2.66)   * Growth percentiles are based on CDC 2-20 Years data.    Physical Exam  Constitutional: He is oriented to person, place, and time. No distress.  HENT:  Mouth/Throat: Oropharynx is clear and moist. No oropharyngeal exudate.  Eyes: Conjunctivae are normal. Right eye exhibits no discharge. Left eye exhibits no discharge. No scleral icterus.  Neck: Normal range of motion. Neck supple. No JVD present. No tracheal deviation present. No thyromegaly present.  Cardiovascular: Normal rate, regular rhythm, normal heart sounds and intact distal pulses.  Exam reveals no gallop and no friction rub.   No murmur heard. Pulmonary/Chest: Effort normal and breath sounds normal. No stridor. No respiratory distress. He has no wheezes. He has no rales. He exhibits no tenderness.  Abdominal: Soft. Bowel sounds are normal. He exhibits no distension and no mass. There is no tenderness. There is no rebound and no guarding.  Musculoskeletal: Normal range of motion. He exhibits no edema or tenderness.  Lymphadenopathy:    He has no cervical adenopathy.  Neurological: He is oriented to person, place, and time.  Skin: Skin is warm and dry. No rash noted. He is not diaphoretic. No erythema. No pallor.  Vitals reviewed.   Lab Results  Component Value Date  WBC 10.9 07/11/2015   HGB 16.2* 07/11/2015   HCT 48.5 07/11/2015   PLT 256.0 07/11/2015   GLUCOSE 100* 07/11/2015   CHOL 164 05/19/2014   TRIG 113.0 05/19/2014   HDL 28.30* 05/19/2014   LDLDIRECT 87.9 11/30/2010   LDLCALC 113* 05/19/2014   ALT 44 07/11/2015   AST 20 07/11/2015   NA 140 07/11/2015   K 3.9 07/11/2015   CL 105 07/11/2015   CREATININE 1.22 07/11/2015   BUN 20  07/11/2015   CO2 26 07/11/2015   TSH 1.41 11/01/2014   HGBA1C 5.6 02/10/2013    Dg Chest 2 View  09/07/2015  CLINICAL DATA:  Cough and shortness of breath for 4 days. EXAM: CHEST  2 VIEW COMPARISON:  02/26/2011 FINDINGS: The heart size and mediastinal contours are within normal limits. Both lungs are clear. The visualized skeletal structures are unremarkable. IMPRESSION: Negative.  No active cardiopulmonary disease. Electronically Signed   By: Myles RosenthalJohn  Stahl M.D.   On: 09/07/2015 14:13    Assessment & Plan:   Ethan Cruz was seen today for hypertension.  Diagnoses and all orders for this visit:  Essential hypertension- his blood pressure is well-controlled, will continue the ARB.   I have discontinued Mr. Ethan Cruz's HYDROcodone-homatropine and methylPREDNISolone. I am also having him maintain his escitalopram, mometasone, and telmisartan.  No orders of the defined types were placed in this encounter.     Follow-up: Return in about 6 months (around 03/29/2016).  Sanda Lingerhomas Jones, MD

## 2015-10-26 ENCOUNTER — Encounter: Payer: Self-pay | Admitting: Internal Medicine

## 2015-12-15 ENCOUNTER — Other Ambulatory Visit: Payer: Self-pay | Admitting: Internal Medicine

## 2016-01-11 ENCOUNTER — Ambulatory Visit (INDEPENDENT_AMBULATORY_CARE_PROVIDER_SITE_OTHER): Payer: BC Managed Care – PPO | Admitting: Internal Medicine

## 2016-01-11 ENCOUNTER — Encounter: Payer: Self-pay | Admitting: Internal Medicine

## 2016-01-11 ENCOUNTER — Ambulatory Visit: Payer: BC Managed Care – PPO | Admitting: Internal Medicine

## 2016-01-11 DIAGNOSIS — R7303 Prediabetes: Secondary | ICD-10-CM

## 2016-01-11 DIAGNOSIS — R21 Rash and other nonspecific skin eruption: Secondary | ICD-10-CM | POA: Diagnosis not present

## 2016-01-11 DIAGNOSIS — I10 Essential (primary) hypertension: Secondary | ICD-10-CM | POA: Diagnosis not present

## 2016-01-11 MED ORDER — TRIAMCINOLONE ACETONIDE 0.1 % EX CREA: 1.0000 "application " | TOPICAL_CREAM | Freq: Two times a day (BID) | CUTANEOUS | 0 refills | Status: DC

## 2016-01-11 MED ORDER — PREDNISONE 10 MG PO TABS: ORAL_TABLET | ORAL | 0 refills | Status: DC

## 2016-01-11 NOTE — Patient Instructions (Signed)
Please take all new medication as prescribed - the prednisone, and the cream as needed  Please continue all other medications as before, and refills have been done if requested.  Please have the pharmacy call with any other refills you may need.  Please keep your appointments with your specialists as you may have planned

## 2016-01-11 NOTE — Assessment & Plan Note (Signed)
stable overall by history and exam, recent data reviewed with pt, and pt to continue medical treatment as before,  to f/u any worsening symptoms or concerns BP Readings from Last 3 Encounters:  01/11/16 106/70  09/28/15 122/82  09/07/15 (!) 132/102

## 2016-01-11 NOTE — Assessment & Plan Note (Signed)
Suspect c/w contact dermatitis, for predpac asd, triam cr prn, benadryl cream and/or benadryl 50 q 6 prn itching as well,  to f/u any worsening symptoms or concerns

## 2016-01-11 NOTE — Progress Notes (Signed)
Subjective:    Patient ID: Ethan Cruz, male    DOB: 1996/01/21, 20 y.o.   MRN: [id number]  HPI  Here with numerous itchy red spots to bilat LE's from mid thigh distal x 4 days, mild to mod itching, without swelling, lip or tongue swelling or wheezing, sob.  Pt denies chest pain, increased sob or doe, wheezing, orthopnea, PND, increased LE swelling, palpitations, dizziness or syncope.  Started the day after a trip to [place] walking in the park in shorts.  Nothing else makes better or worse.   Pt denies fever, wt loss, night sweats, loss of appetite, or other constitutional symptoms  Has not tried any OTC meds such as benadryl.  Has also used a mary kay lotion product to all extremities recently but has no rash other than legs. Past Medical History:  Diagnosis Date  . ADHD (attention deficit hyperactivity disorder)   . Allergic rhinitis, cause unspecified 09/19/2011  . Asthma 09/19/2011  . Auditory processing disorder   . Depression   . Dysgraphia   . Glucose intolerance (pre-diabetes) 2011  . Morbid obesity (HCC)    Past Surgical History:  Procedure Laterality Date  . NO PAST SURGERIES      reports that he has never smoked. He has never used smokeless tobacco. He reports that he does not drink alcohol or use drugs. family history includes Arthritis in his mother and other; Clotting disorder (age of onset: 57) in his mother; Diabetes in his mother and other; Heart disease in his other; Hypertension in his other; Ovarian cancer in his other; Stroke in his other. No Known Allergies Current Outpatient Prescriptions on File Prior to Visit  Medication Sig Dispense Refill  . escitalopram (LEXAPRO) 20 MG tablet TAKE 1 TABLET(20 MG) BY MOUTH DAILY 90 tablet 2  . telmisartan (MICARDIS) 40 MG tablet Take 1 tablet (40 mg total) by mouth daily. 30 tablet 3  . mometasone (NASONEX) 50 MCG/ACT nasal spray Place 4 sprays into the nose daily. (Patient not taking: Reported on 01/11/2016) 17 g 12    No current facility-administered medications on file prior to visit.    Review of Systems  Constitutional: Negative for unusual diaphoresis or night sweats HENT: Negative for ear swelling or discharge Eyes: Negative for worsening visual haziness  Respiratory: Negative for choking and stridor.   Gastrointestinal: Negative for distension or worsening eructation Genitourinary: Negative for retention or change in urine volume.  Musculoskeletal: Negative for other MSK pain or swelling Skin: Negative for color change and worsening wound Neurological: Negative for tremors and numbness other than noted  Psychiatric/Behavioral: Negative for decreased concentration or agitation other than above       Objective:   Physical Exam BP 106/70   Pulse 68   Temp 97.9 F (36.6 C)   Wt 250 lb (113.4 kg)   SpO2 98%   BMI 32.07 kg/m  VS noted,  Constitutional: Pt appears in no apparent distress HENT: Head: NCAT.  Right Ear: External ear normal.  Left Ear: External ear normal.  Eyes: . Pupils are equal, round, and reactive to light. Conjunctivae and EOM are normal Neck: Normal range of motion. Neck supple.  Cardiovascular: Normal rate and regular rhythm.   Pulmonary/Chest: Effort normal and breath sounds without rales or wheezing.  Abd:  Soft, NT, ND, + BS Neurological: Pt is alert. Not confused , motor grossly intact Skin: Skin is warm. no LE edema, + nontender TNTC small pruritic erythem lesions slightly raised 3-5 mm to  both legs from mid thigh to ankles only Psychiatric: Pt behavior is normal. No agitation.     Assessment & Plan:

## 2016-01-11 NOTE — Progress Notes (Signed)
Pre visit review using our clinic review tool, if applicable. No additional management support is needed unless otherwise documented below in the visit note. 

## 2016-01-11 NOTE — Assessment & Plan Note (Signed)
stable overall by history and exam, recent data reviewed with pt, and pt to continue medical treatment as before,  to f/u any worsening symptoms or concerns Lab Results  Component Value Date   HGBA1C 5.6 02/10/2013   Pt to call for onset polys or cbg > 200 with steroid tx

## 2016-02-17 ENCOUNTER — Other Ambulatory Visit: Payer: Self-pay | Admitting: Internal Medicine

## 2016-02-17 DIAGNOSIS — I10 Essential (primary) hypertension: Secondary | ICD-10-CM

## 2016-03-11 ENCOUNTER — Ambulatory Visit: Payer: BC Managed Care – PPO | Admitting: Internal Medicine

## 2016-03-11 ENCOUNTER — Telehealth: Payer: Self-pay | Admitting: Internal Medicine

## 2016-03-11 NOTE — Telephone Encounter (Signed)
Call him 

## 2016-03-11 NOTE — Telephone Encounter (Signed)
Patient no showed for follow up 10/2.  Please advise.

## 2016-03-14 NOTE — Telephone Encounter (Signed)
Mom is going to get patient to call back to reschedule

## 2016-03-20 ENCOUNTER — Ambulatory Visit: Payer: Self-pay | Admitting: Internal Medicine

## 2016-03-21 ENCOUNTER — Ambulatory Visit (INDEPENDENT_AMBULATORY_CARE_PROVIDER_SITE_OTHER): Payer: BC Managed Care – PPO | Admitting: Internal Medicine

## 2016-03-21 ENCOUNTER — Encounter: Payer: Self-pay | Admitting: Internal Medicine

## 2016-03-21 ENCOUNTER — Other Ambulatory Visit (INDEPENDENT_AMBULATORY_CARE_PROVIDER_SITE_OTHER): Payer: BC Managed Care – PPO

## 2016-03-21 VITALS — BP 130/90 | HR 81 | Temp 97.8°F | Ht 74.0 in | Wt 256.2 lb

## 2016-03-21 DIAGNOSIS — R7303 Prediabetes: Secondary | ICD-10-CM

## 2016-03-21 DIAGNOSIS — I10 Essential (primary) hypertension: Secondary | ICD-10-CM | POA: Diagnosis not present

## 2016-03-21 DIAGNOSIS — F409 Phobic anxiety disorder, unspecified: Secondary | ICD-10-CM | POA: Diagnosis not present

## 2016-03-21 DIAGNOSIS — Z23 Encounter for immunization: Secondary | ICD-10-CM | POA: Diagnosis not present

## 2016-03-21 DIAGNOSIS — F5105 Insomnia due to other mental disorder: Secondary | ICD-10-CM

## 2016-03-21 LAB — BASIC METABOLIC PANEL
BUN: 11 mg/dL (ref 6–23)
CALCIUM: 9.5 mg/dL (ref 8.4–10.5)
CO2: 31 mEq/L (ref 19–32)
CREATININE: 1.09 mg/dL (ref 0.40–1.50)
Chloride: 104 mEq/L (ref 96–112)
GFR: 91.47 mL/min (ref >60.00)
GLUCOSE: 93 mg/dL (ref 70–99)
Potassium: 4.7 mEq/L (ref 3.5–5.1)
Sodium: 140 mEq/L (ref 135–145)

## 2016-03-21 LAB — HEMOGLOBIN A1C: HEMOGLOBIN A1C: 5.4 % (ref 4.6–6.5)

## 2016-03-21 MED ORDER — SUVOREXANT 15 MG PO TABS: 1.0000 | ORAL_TABLET | Freq: Every evening | ORAL | 5 refills | Status: DC | PRN

## 2016-03-21 NOTE — Progress Notes (Signed)
Subjective:  Patient ID: Ethan Cruz, male    DOB: 10/27/1995  Age: 20 y.o. MRN: [id number]  CC: Hypertension   HPI Ethan Cruz presents for a blood pressure check and concerns about insomnia.  He tells me has had insomnia for over a decade. He has some difficulty falling asleep but also suffers from frequent awakening. He is recently taken Advil PM which is helped him fall asleep but has not kept him asleep. He has several stressors at the time to limit me that his girlfriend has cancer and his mother has precancerous lesions.   He is also due for a blood pressure check. He tells me his blood pressure has been well-controlled home and he has had no concerning symptoms.  Outpatient Medications Prior to Visit  Medication Sig Dispense Refill  . escitalopram (LEXAPRO) 20 MG tablet TAKE 1 TABLET(20 MG) BY MOUTH DAILY 90 tablet 2  . telmisartan (MICARDIS) 40 MG tablet TAKE 1 TABLET(40 MG) BY MOUTH DAILY 30 tablet 5  . mometasone (NASONEX) 50 MCG/ACT nasal spray Place 4 sprays into the nose daily. (Patient not taking: Reported on 01/11/2016) 17 g 12  . predniSONE (DELTASONE) 10 MG tablet 3 tabs by mouth per day for 3 days,2tabs per day for 3 days,1tab per day for 3 days 18 tablet 0  . triamcinolone cream (KENALOG) 0.1 % Apply 1 application topically 2 (two) times daily. 30 g 0   No facility-administered medications prior to visit.     ROS Review of Systems  Constitutional: Negative.  Negative for activity change, diaphoresis, fatigue and unexpected weight change.  HENT: Negative.  Negative for trouble swallowing and voice change.   Eyes: Negative.  Negative for photophobia and visual disturbance.  Respiratory: Negative.  Negative for cough, choking, chest tightness, shortness of breath, wheezing and stridor.   Cardiovascular: Negative.  Negative for chest pain, palpitations and leg swelling.  Gastrointestinal: Negative.  Negative for abdominal pain, constipation, diarrhea, nausea and  vomiting.  Endocrine: Negative.   Genitourinary: Negative.  Negative for difficulty urinating.  Musculoskeletal: Negative.  Negative for back pain, myalgias and neck pain.  Skin: Negative.   Allergic/Immunologic: Negative.   Neurological: Negative.  Negative for dizziness, seizures, facial asymmetry, weakness and headaches.  Hematological: Negative for adenopathy. Does not bruise/bleed easily.  Psychiatric/Behavioral: Positive for sleep disturbance. Negative for behavioral problems, confusion, decreased concentration and dysphoric mood. The patient is not nervous/anxious.     Objective:  BP 130/90 (BP Location: Left Arm, Patient Position: Sitting, Cuff Size: Large)   Pulse 81   Temp 97.8 F (36.6 C) (Oral)   Ht 6\' 2"  (1.88 m)   Wt 256 lb 4 oz (116.2 kg)   SpO2 98%   BMI 32.90 kg/m   BP Readings from Last 3 Encounters:  03/21/16 130/90  01/11/16 106/70  09/28/15 122/82    Wt Readings from Last 3 Encounters:  03/21/16 256 lb 4 oz (116.2 kg)  01/11/16 250 lb (113.4 kg)  09/28/15 252 lb (114.3 kg) (>99 %, Z > 2.33)*   * Growth percentiles are based on CDC 2-20 Years data.    Physical Exam  Constitutional: He is oriented to person, place, and time. No distress.  HENT:  Mouth/Throat: Oropharynx is clear and moist. No oropharyngeal exudate.  Eyes: Conjunctivae are normal. Right eye exhibits no discharge. Left eye exhibits no discharge. No scleral icterus.  Neck: Normal range of motion. Neck supple. No JVD present. No tracheal deviation present. No thyromegaly present.  Cardiovascular:  Normal rate, regular rhythm, normal heart sounds and intact distal pulses.  Exam reveals no gallop and no friction rub.   No murmur heard. Pulmonary/Chest: Effort normal and breath sounds normal. No stridor. No respiratory distress. He has no wheezes. He has no rales. He exhibits no tenderness.  Abdominal: Soft. Bowel sounds are normal. He exhibits no distension and no mass. There is no tenderness.  There is no rebound and no guarding.  Musculoskeletal: Normal range of motion. He exhibits no edema, tenderness or deformity.  Lymphadenopathy:    He has no cervical adenopathy.  Neurological: He is oriented to person, place, and time.  Skin: Skin is warm and dry. No rash noted. He is not diaphoretic. No erythema. No pallor.  Psychiatric: He has a normal mood and affect. His behavior is normal. Judgment and thought content normal.  Vitals reviewed.   Lab Results  Component Value Date   WBC 10.9 07/11/2015   HGB 16.2 (H) 07/11/2015   HCT 48.5 07/11/2015   PLT 256.0 07/11/2015   GLUCOSE 93 03/21/2016   CHOL 164 05/19/2014   TRIG 113.0 05/19/2014   HDL 28.30 (L) 05/19/2014   LDLDIRECT 87.9 11/30/2010   LDLCALC 113 (H) 05/19/2014   ALT 44 07/11/2015   AST 20 07/11/2015   NA 140 03/21/2016   K 4.7 03/21/2016   CL 104 03/21/2016   CREATININE 1.09 03/21/2016   BUN 11 03/21/2016   CO2 31 03/21/2016   TSH 1.41 11/01/2014   HGBA1C 5.4 03/21/2016    Dg Chest 2 View  Result Date: 09/07/2015 CLINICAL DATA:  Cough and shortness of breath for 4 days. EXAM: CHEST  2 VIEW COMPARISON:  02/26/2011 FINDINGS: The heart size and mediastinal contours are within normal limits. Both lungs are clear. The visualized skeletal structures are unremarkable. IMPRESSION: Negative.  No active cardiopulmonary disease. Electronically Signed   By: Myles RosenthalJohn  Stahl M.D.   On: 09/07/2015 14:13    Assessment & Plan:   [name] was seen today for hypertension.  Diagnoses and all orders for this visit:  Essential hypertension- His blood pressures well controlled, electrolytes and renal function are stable. -     Basic metabolic panel; Future  Prediabetes- improvement noted, his A1c is down to 5.4%, he will continue with his lifestyle modifications. -     Basic metabolic panel; Future -     Hemoglobin A1c; Future  Insomnia due to anxiety and fear- will try Belsomra to see if this helps control his symptoms. -      Suvorexant (BELSOMRA) 15 MG TABS; Take 1 tablet by mouth at bedtime as needed.  Need for prophylactic vaccination and inoculation against influenza -     Flu Vaccine QUAD 36+ mos IM   I have discontinued Mr. Wilkerson's mometasone and predniSONE. I am also having him start on Suvorexant. Additionally, I am having him maintain his escitalopram, telmisartan, and triamcinolone cream.  Meds ordered this encounter  Medications  . triamcinolone cream (KENALOG) 0.1 %    Sig: APP EXT AA BID    Refill:  0  . Suvorexant (BELSOMRA) 15 MG TABS    Sig: Take 1 tablet by mouth at bedtime as needed.    Dispense:  30 tablet    Refill:  5     Follow-up: Return in about 6 months (around 09/19/2016).  Sanda Lingerhomas Shardea Cwynar, MD

## 2016-03-21 NOTE — Patient Instructions (Signed)
Insomnia Insomnia is a sleep disorder that makes it difficult to fall asleep or to stay asleep. Insomnia can cause tiredness (fatigue), low energy, difficulty concentrating, mood swings, and poor performance at work or school.  There are three different ways to classify insomnia:  Difficulty falling asleep.  Difficulty staying asleep.  Waking up too early in the morning. Any type of insomnia can be long-term (chronic) or short-term (acute). Both are common. Short-term insomnia usually lasts for three months or less. Chronic insomnia occurs at least three times a week for longer than three months. CAUSES  Insomnia may be caused by another condition, situation, or substance, such as:  Anxiety.  Certain medicines.  Gastroesophageal reflux disease (GERD) or other gastrointestinal conditions.  Asthma or other breathing conditions.  Restless legs syndrome, sleep apnea, or other sleep disorders.  Chronic pain.  Menopause. This may include hot flashes.  Stroke.  Abuse of alcohol, tobacco, or illegal drugs.  Depression.  Caffeine.   Neurological disorders, such as Alzheimer disease.  An overactive thyroid (hyperthyroidism). The cause of insomnia may not be known. RISK FACTORS Risk factors for insomnia include:  Gender. Women are more commonly affected than men.  Age. Insomnia is more common as you get older.  Stress. This may involve your professional or personal life.  Income. Insomnia is more common in people with lower income.  Lack of exercise.   Irregular work schedule or night shifts.  Traveling between different time zones. SIGNS AND SYMPTOMS If you have insomnia, trouble falling asleep or trouble staying asleep is the main symptom. This may lead to other symptoms, such as:  Feeling fatigued.  Feeling nervous about going to sleep.  Not feeling rested in the morning.  Having trouble concentrating.  Feeling irritable, anxious, or depressed. TREATMENT   Treatment for insomnia depends on the cause. If your insomnia is caused by an underlying condition, treatment will focus on addressing the condition. Treatment may also include:   Medicines to help you sleep.  Counseling or therapy.  Lifestyle adjustments. HOME CARE INSTRUCTIONS   Take medicines only as directed by your health care provider.  Keep regular sleeping and waking hours. Avoid naps.  Keep a sleep diary to help you and your health care provider figure out what could be causing your insomnia. Include:   When you sleep.  When you wake up during the night.  How well you sleep.   How rested you feel the next day.  Any side effects of medicines you are taking.  What you eat and drink.   Make your bedroom a comfortable place where it is easy to fall asleep:  Put up shades or special blackout curtains to block light from outside.  Use a white noise machine to block noise.  Keep the temperature cool.   Exercise regularly as directed by your health care provider. Avoid exercising right before bedtime.  Use relaxation techniques to manage stress. Ask your health care provider to suggest some techniques that may work well for you. These may include:  Breathing exercises.  Routines to release muscle tension.  Visualizing peaceful scenes.  Cut back on alcohol, caffeinated beverages, and cigarettes, especially close to bedtime. These can disrupt your sleep.  Do not overeat or eat spicy foods right before bedtime. This can lead to digestive discomfort that can make it hard for you to sleep.  Limit screen use before bedtime. This includes:  Watching TV.  Using your smartphone, tablet, and computer.  Stick to a routine. This   can help you fall asleep faster. Try to do a quiet activity, brush your teeth, and go to bed at the same time each night.  Get out of bed if you are still awake after 15 minutes of trying to sleep. Keep the lights down, but try reading or  doing a quiet activity. When you feel sleepy, go back to bed.  Make sure that you drive carefully. Avoid driving if you feel very sleepy.  Keep all follow-up appointments as directed by your health care provider. This is important. SEEK MEDICAL CARE IF:   You are tired throughout the day or have trouble in your daily routine due to sleepiness.  You continue to have sleep problems or your sleep problems get worse. SEEK IMMEDIATE MEDICAL CARE IF:   You have serious thoughts about hurting yourself or someone else.   This information is not intended to replace advice given to you by your health care provider. Make sure you discuss any questions you have with your health care provider.   Document Released: 05/24/2000 Document Revised: 02/15/2015 Document Reviewed: 02/25/2014 Elsevier Interactive Patient Education 2016 [employer]  

## 2016-03-21 NOTE — Progress Notes (Signed)
Pre visit review using our clinic review tool, if applicable. No additional management support is needed unless otherwise documented below in the visit note. 

## 2016-03-24 ENCOUNTER — Encounter: Payer: Self-pay | Admitting: Internal Medicine

## 2016-03-25 ENCOUNTER — Telehealth: Payer: Self-pay | Admitting: Internal Medicine

## 2016-03-25 ENCOUNTER — Other Ambulatory Visit: Payer: Self-pay | Admitting: Internal Medicine

## 2016-03-25 DIAGNOSIS — F409 Phobic anxiety disorder, unspecified: Secondary | ICD-10-CM

## 2016-03-25 DIAGNOSIS — F5105 Insomnia due to other mental disorder: Principal | ICD-10-CM

## 2016-03-25 MED ORDER — SUVOREXANT 20 MG PO TABS: 1.0000 | ORAL_TABLET | Freq: Every evening | ORAL | 5 refills | Status: DC | PRN

## 2016-03-25 NOTE — Telephone Encounter (Signed)
Please advise 

## 2016-03-25 NOTE — Telephone Encounter (Signed)
Is requesting increase on belsomra to 20mg  from 15mg .

## 2016-03-25 NOTE — Telephone Encounter (Signed)
Pt contacted and he indicated there is a PA for it.

## 2016-03-25 NOTE — Telephone Encounter (Signed)
This has already been taken care of Rx was faxed to pharmacy this morning

## 2016-03-26 NOTE — Telephone Encounter (Signed)
PA for Belsomra started  Key: [id number]

## 2016-03-26 NOTE — Telephone Encounter (Signed)
Patient came in regard to this.  Please follow up once PA is approved.

## 2016-03-28 NOTE — Telephone Encounter (Signed)
Pt called back. Pt stated that he will try some other OTC options and call back if he needs to try an alternative.

## 2016-03-28 NOTE — Telephone Encounter (Signed)
PA for belsomra was denied.   LVM for pt to call back as soon as possible.  RE: Pa denied

## 2016-04-29 ENCOUNTER — Ambulatory Visit: Payer: BC Managed Care – PPO | Admitting: Family Medicine

## 2016-06-27 ENCOUNTER — Ambulatory Visit: Payer: Self-pay | Admitting: Internal Medicine

## 2016-07-01 ENCOUNTER — Encounter: Payer: Self-pay | Admitting: Nurse Practitioner

## 2016-07-01 ENCOUNTER — Ambulatory Visit (INDEPENDENT_AMBULATORY_CARE_PROVIDER_SITE_OTHER): Payer: BC Managed Care – PPO | Admitting: Nurse Practitioner

## 2016-07-01 VITALS — BP 122/82 | HR 80 | Temp 98.6°F | Ht 74.0 in | Wt 263.0 lb

## 2016-07-01 DIAGNOSIS — J0100 Acute maxillary sinusitis, unspecified: Secondary | ICD-10-CM

## 2016-07-01 MED ORDER — METHYLPREDNISOLONE 4 MG PO TBPK: ORAL_TABLET | ORAL | 0 refills | Status: DC

## 2016-07-01 MED ORDER — AZITHROMYCIN 500 MG PO TABS: 500.0000 mg | ORAL_TABLET | Freq: Every day | ORAL | 0 refills | Status: DC

## 2016-07-01 MED ORDER — DM-GUAIFENESIN ER 30-600 MG PO TB12: 1.0000 | ORAL_TABLET | Freq: Two times a day (BID) | ORAL | 0 refills | Status: DC | PRN

## 2016-07-01 MED ORDER — BENZONATATE 100 MG PO CAPS: 100.0000 mg | ORAL_CAPSULE | Freq: Three times a day (TID) | ORAL | 0 refills | Status: DC | PRN

## 2016-07-01 MED ORDER — SALINE SPRAY 0.65 % NA SOLN: 1.0000 | NASAL | 0 refills | Status: DC | PRN

## 2016-07-01 NOTE — Progress Notes (Signed)
Pre visit review using our clinic review tool, if applicable. No additional management support is needed unless otherwise documented below in the visit note. 

## 2016-07-01 NOTE — Progress Notes (Signed)
Subjective:  Patient ID: Ethan Cruz, male    DOB: Aug 13, 1995  Age: 21 y.o. MRN: [id number]  CC: Cough (coughing yellow mucus,congestion,pressure,loss voice--sore throat going on for 3 wks. )   Cough  This is a new problem. The current episode started 1 to 4 weeks ago. The problem has been waxing and waning. The problem occurs constantly. The cough is non-productive. Associated symptoms include ear congestion, ear pain, headaches, nasal congestion, postnasal drip, rhinorrhea and a sore throat. Pertinent negatives include no chest pain, chills, fever, shortness of breath or wheezing. The symptoms are aggravated by cold air. He has tried OTC cough suppressant and prescription cough suppressant for the symptoms. The treatment provided mild relief. His past medical history is significant for environmental allergies.    Outpatient Medications Prior to Visit  Medication Sig Dispense Refill  . escitalopram (LEXAPRO) 20 MG tablet TAKE 1 TABLET(20 MG) BY MOUTH DAILY 90 tablet 2  . telmisartan (MICARDIS) 40 MG tablet TAKE 1 TABLET(40 MG) BY MOUTH DAILY 30 tablet 5  . triamcinolone cream (KENALOG) 0.1 % APP EXT AA BID  0  . Suvorexant (BELSOMRA) 20 MG TABS Take 1 tablet by mouth at bedtime as needed. (Patient not taking: Reported on 07/01/2016) 30 tablet 5   No facility-administered medications prior to visit.     ROS See HPI  Objective:  BP 122/82   Pulse 80   Temp 98.6 F (37 C)   Ht 6\' 2"  (1.88 m)   Wt 263 lb (119.3 kg)   SpO2 98%   BMI 33.77 kg/m   BP Readings from Last 3 Encounters:  07/01/16 122/82  03/21/16 130/90  01/11/16 106/70    Wt Readings from Last 3 Encounters:  07/01/16 263 lb (119.3 kg)  03/21/16 256 lb 4 oz (116.2 kg)  01/11/16 250 lb (113.4 kg)    Physical Exam  Constitutional: He is oriented to person, place, and time. No distress.  HENT:  Right Ear: External ear and ear canal normal. Tympanic membrane is not injected. A middle ear effusion is present.    Left Ear: Ear canal normal. Tympanic membrane is not injected. A middle ear effusion is present.  Nose: Mucosal edema and rhinorrhea present. Right sinus exhibits maxillary sinus tenderness and frontal sinus tenderness. Left sinus exhibits maxillary sinus tenderness and frontal sinus tenderness.  Mouth/Throat: Uvula is midline. Posterior oropharyngeal erythema present. No oropharyngeal exudate.  Eyes: No scleral icterus.  Neck: Normal range of motion. Neck supple.  Cardiovascular: Normal rate and regular rhythm.   Pulmonary/Chest: Effort normal and breath sounds normal.  Lymphadenopathy:    He has no cervical adenopathy.  Neurological: He is alert and oriented to person, place, and time.  Vitals reviewed.   Lab Results  Component Value Date   WBC 10.9 07/11/2015   HGB 16.2 (H) 07/11/2015   HCT 48.5 07/11/2015   PLT 256.0 07/11/2015   GLUCOSE 93 03/21/2016   CHOL 164 05/19/2014   TRIG 113.0 05/19/2014   HDL 28.30 (L) 05/19/2014   LDLDIRECT 87.9 11/30/2010   LDLCALC 113 (H) 05/19/2014   ALT 44 07/11/2015   AST 20 07/11/2015   NA 140 03/21/2016   K 4.7 03/21/2016   CL 104 03/21/2016   CREATININE 1.09 03/21/2016   BUN 11 03/21/2016   CO2 31 03/21/2016   TSH 1.41 11/01/2014   HGBA1C 5.4 03/21/2016    Dg Chest 2 View  Result Date: 09/07/2015 CLINICAL DATA:  Cough and shortness of breath for 4 days.  EXAM: CHEST  2 VIEW COMPARISON:  02/26/2011 FINDINGS: The heart size and mediastinal contours are within normal limits. Both lungs are clear. The visualized skeletal structures are unremarkable. IMPRESSION: Negative.  No active cardiopulmonary disease. Electronically Signed   By: Myles RosenthalJohn  Stahl M.D.   On: 09/07/2015 14:13    Assessment & Plan:   [name] was seen today for cough.  Diagnoses and all orders for this visit:  Acute non-recurrent maxillary sinusitis -     methylPREDNISolone (MEDROL DOSEPAK) 4 MG TBPK tablet; Take as directed on package -     azithromycin (ZITHROMAX) 500  MG tablet; Take 1 tablet (500 mg total) by mouth daily. -     sodium chloride (OCEAN) 0.65 % SOLN nasal spray; Place 1 spray into both nostrils as needed for congestion. -     dextromethorphan-guaiFENesin (MUCINEX DM) 30-600 MG 12hr tablet; Take 1 tablet by mouth 2 (two) times daily as needed for cough. -     benzonatate (TESSALON) 100 MG capsule; Take 1 capsule (100 mg total) by mouth 3 (three) times daily as needed for cough.   I have discontinued Ethan Cruz's Suvorexant. I am also having him start on methylPREDNISolone, azithromycin, sodium chloride, dextromethorphan-guaiFENesin, and benzonatate. Additionally, I am having him maintain his escitalopram, telmisartan, triamcinolone cream, TURMERIC PO, and TART CHERRY ADVANCED.  Meds ordered this encounter  Medications  . TURMERIC PO    Sig: Take by mouth.  . Misc Natural Products (TART CHERRY ADVANCED) CAPS    Sig: Take by mouth.  . methylPREDNISolone (MEDROL DOSEPAK) 4 MG TBPK tablet    Sig: Take as directed on package    Dispense:  21 tablet    Refill:  0    Order Specific Question:   Supervising Provider    Answer:   Tresa GarterPLOTNIKOV, ALEKSEI V [[id number]]  . azithromycin (ZITHROMAX) 500 MG tablet    Sig: Take 1 tablet (500 mg total) by mouth daily.    Dispense:  5 tablet    Refill:  0    Order Specific Question:   Supervising Provider    Answer:   Tresa GarterPLOTNIKOV, ALEKSEI V [[id number]]  . sodium chloride (OCEAN) 0.65 % SOLN nasal spray    Sig: Place 1 spray into both nostrils as needed for congestion.    Dispense:  15 mL    Refill:  0    Order Specific Question:   Supervising Provider    Answer:   Tresa GarterPLOTNIKOV, ALEKSEI V [[id number]]  . dextromethorphan-guaiFENesin (MUCINEX DM) 30-600 MG 12hr tablet    Sig: Take 1 tablet by mouth 2 (two) times daily as needed for cough.    Dispense:  14 tablet    Refill:  0    Order Specific Question:   Supervising Provider    Answer:   Tresa GarterPLOTNIKOV, ALEKSEI V [[id number]]  . benzonatate (TESSALON) 100 MG capsule    Sig: Take 1  capsule (100 mg total) by mouth 3 (three) times daily as needed for cough.    Dispense:  20 capsule    Refill:  0    Order Specific Question:   Supervising Provider    Answer:   Tresa GarterPLOTNIKOV, ALEKSEI V [[id number]]    Follow-up: Return if symptoms worsen or fail to improve.  Alysia Pennaharlotte Khylin Gutridge, NP

## 2016-07-01 NOTE — Patient Instructions (Signed)
URI Instructions: Encourage adequate oral hydration.  Use over-the-counter  "cold" medicines  such as "Tylenol cold" , "Advil cold",  "Mucinex" or" Mucinex D"  for cough and congestion.  Avoid decongestants if you have high blood pressure. Use" Delsym" or" Robitussin" cough syrup varietis for cough.  You can use plain "Tylenol" or "Advi"l for fever, chills and achyness.   

## 2016-09-19 ENCOUNTER — Ambulatory Visit: Payer: BC Managed Care – PPO | Admitting: Internal Medicine

## 2016-09-26 ENCOUNTER — Encounter: Payer: Self-pay | Admitting: Internal Medicine

## 2016-09-26 ENCOUNTER — Other Ambulatory Visit (INDEPENDENT_AMBULATORY_CARE_PROVIDER_SITE_OTHER): Payer: BC Managed Care – PPO

## 2016-09-26 ENCOUNTER — Ambulatory Visit (INDEPENDENT_AMBULATORY_CARE_PROVIDER_SITE_OTHER): Payer: BC Managed Care – PPO | Admitting: Internal Medicine

## 2016-09-26 VITALS — BP 118/78 | HR 74 | Temp 97.9°F | Resp 16 | Ht 74.0 in | Wt 263.0 lb

## 2016-09-26 DIAGNOSIS — I10 Essential (primary) hypertension: Secondary | ICD-10-CM

## 2016-09-26 DIAGNOSIS — R7303 Prediabetes: Secondary | ICD-10-CM | POA: Diagnosis not present

## 2016-09-26 DIAGNOSIS — M436 Torticollis: Secondary | ICD-10-CM

## 2016-09-26 LAB — CBC WITH DIFFERENTIAL/PLATELET
BASOS PCT: 0.9 % (ref 0.0–3.0)
Basophils Absolute: 0.1 10*3/uL (ref 0.0–0.1)
EOS ABS: 0.3 10*3/uL (ref 0.0–0.7)
Eosinophils Relative: 4.1 % (ref 0.0–5.0)
HCT: 46.1 % (ref 39.0–52.0)
HEMOGLOBIN: 15.6 g/dL (ref 13.0–17.0)
Lymphocytes Relative: 32.7 % (ref 12.0–46.0)
Lymphs Abs: 2.5 10*3/uL (ref 0.7–4.0)
MCHC: 33.9 g/dL (ref 30.0–36.0)
MCV: 88.6 fl (ref 78.0–100.0)
MONO ABS: 0.7 10*3/uL (ref 0.1–1.0)
Monocytes Relative: 9.2 % (ref 3.0–12.0)
NEUTROS ABS: 4 10*3/uL (ref 1.4–7.7)
Neutrophils Relative %: 53.1 % (ref 43.0–77.0)
PLATELETS: 252 10*3/uL (ref 150.0–400.0)
RBC: 5.2 Mil/uL (ref 4.22–5.81)
RDW: 13.4 % (ref 11.5–14.6)
WBC: 7.6 10*3/uL (ref 4.5–10.5)

## 2016-09-26 LAB — BASIC METABOLIC PANEL
BUN: 13 mg/dL (ref 6–23)
CO2: 27 mEq/L (ref 19–32)
Calcium: 9.5 mg/dL (ref 8.4–10.5)
Chloride: 104 mEq/L (ref 96–112)
Creatinine, Ser: 0.96 mg/dL (ref 0.40–1.50)
GFR: 105.36 mL/min (ref >60.00)
Glucose, Bld: 103 mg/dL — ABNORMAL HIGH (ref 70–99)
POTASSIUM: 4.2 meq/L (ref 3.5–5.1)
SODIUM: 139 meq/L (ref 135–145)

## 2016-09-26 LAB — TSH: TSH: 2.38 u[IU]/mL (ref 0.35–5.50)

## 2016-09-26 LAB — HEMOGLOBIN A1C: Hgb A1c MFr Bld: 5.6 % (ref 4.6–6.5)

## 2016-09-26 MED ORDER — IBUPROFEN 600 MG PO TABS: 600.0000 mg | ORAL_TABLET | Freq: Three times a day (TID) | ORAL | 0 refills | Status: DC | PRN

## 2016-09-26 MED ORDER — CYCLOBENZAPRINE HCL 7.5 MG PO TABS: 7.5000 mg | ORAL_TABLET | Freq: Three times a day (TID) | ORAL | 0 refills | Status: DC | PRN

## 2016-09-26 NOTE — Progress Notes (Signed)
Pre visit review using our clinic review tool, if applicable. No additional management support is needed unless otherwise documented below in the visit note. 

## 2016-09-26 NOTE — Patient Instructions (Signed)
Acute Torticollis Torticollis is a condition in which the muscles of the neck tighten (contract) abnormally, causing the neck to twist and the head to move into an unnatural position. Torticollis that develops suddenly is called acute torticollis. If torticollis becomes chronic and is left untreated, the face and neck can become deformed. CAUSES This condition may be caused by:  Sleeping in an awkward position (common).  Extending or twisting the neck muscles beyond their normal position.  Infection. In some cases, the cause may not be known. SYMPTOMS Symptoms of this condition include:  An unnatural position of the head.  Neck pain.  A limited ability to move the neck.  Twisting of the neck to one side. DIAGNOSIS This condition is diagnosed with a physical exam. You may also have imaging tests, such as an X-ray, CT scan, or MRI. TREATMENT Treatment for this condition involves trying to relax the neck muscles. It may include:  Medicines or shots.  Physical therapy.  Surgery. This may be done in severe cases. HOME CARE INSTRUCTIONS  Take medicines only as directed by your health care provider.  Do stretching exercises and massage your neck as directed by your health care provider.  Keep all follow-up visits as directed by your health care provider. This is important. SEEK MEDICAL CARE IF:  You develop a fever. SEEK IMMEDIATE MEDICAL CARE IF:  You develop difficulty breathing.  You develop noisy breathing (stridor).  You start drooling.  You have trouble swallowing or have pain with swallowing.  You develop numbness or weakness in your hands or feet.  You have changes in your speech, understanding, or vision.  Your pain gets worse. This information is not intended to replace advice given to you by your health care provider. Make sure you discuss any questions you have with your health care provider. Document Released: 05/24/2000 Document Revised: 09/18/2015  Document Reviewed: 05/23/2014 Elsevier Interactive Patient Education  2017 [employer]  

## 2016-09-26 NOTE — Progress Notes (Signed)
Subjective:  Patient ID: Ethan Cruz, male    DOB: 03/05/1996  Age: 21 y.o. MRN: [id number]  CC: Hypertension and Neck Pain   HPI Ethan Cruz presents for a BP check - He complains that one day prior to this visit he woke up in the morning with soreness on the right side of his neck with no preceding trauma or injury. The soreness radiates into his trapezius but not into his extremities. He has not taken anything for pain and he denies numbness/weakness/tingling. He also tells me his blood pressure has been well controlled.  Outpatient Medications Prior to Visit  Medication Sig Dispense Refill  . escitalopram (LEXAPRO) 20 MG tablet TAKE 1 TABLET(20 MG) BY MOUTH DAILY 90 tablet 2  . telmisartan (MICARDIS) 40 MG tablet TAKE 1 TABLET(40 MG) BY MOUTH DAILY 30 tablet 5  . TURMERIC PO Take by mouth.    Marland Kitchen azithromycin (ZITHROMAX) 500 MG tablet Take 1 tablet (500 mg total) by mouth daily. 5 tablet 0  . benzonatate (TESSALON) 100 MG capsule Take 1 capsule (100 mg total) by mouth 3 (three) times daily as needed for cough. 20 capsule 0  . dextromethorphan-guaiFENesin (MUCINEX DM) 30-600 MG 12hr tablet Take 1 tablet by mouth 2 (two) times daily as needed for cough. 14 tablet 0  . methylPREDNISolone (MEDROL DOSEPAK) 4 MG TBPK tablet Take as directed on package 21 tablet 0  . Misc Natural Products (TART CHERRY ADVANCED) CAPS Take by mouth.    . sodium chloride (OCEAN) 0.65 % SOLN nasal spray Place 1 spray into both nostrils as needed for congestion. 15 mL 0  . triamcinolone cream (KENALOG) 0.1 % APP EXT AA BID  0   No facility-administered medications prior to visit.     ROS Review of Systems  Constitutional: Negative.  Negative for diaphoresis, fatigue and unexpected weight change.  HENT: Negative.   Eyes: Negative for visual disturbance.  Respiratory: Negative for cough, chest tightness, shortness of breath and wheezing.   Cardiovascular: Negative for chest pain, palpitations and leg swelling.   Gastrointestinal: Negative for abdominal pain, constipation, diarrhea and nausea.  Endocrine: Negative.   Genitourinary: Negative.   Musculoskeletal: Positive for neck pain and neck stiffness. Negative for arthralgias and back pain.  Skin: Negative.   Allergic/Immunologic: Negative.   Neurological: Negative for dizziness and weakness.  Hematological: Negative.  Negative for adenopathy. Does not bruise/bleed easily.  Psychiatric/Behavioral: Negative.     Objective:  BP 118/78 (BP Location: Left Arm, Patient Position: Sitting, Cuff Size: Large)   Pulse 74   Temp 97.9 F (36.6 C) (Oral)   Resp 16   Ht  (1.88 m)   Wt 263 lb (119.3 kg)   SpO2 99%   BMI 33.77 kg/m   BP Readings from Last 3 Encounters:  09/26/16 118/78  07/01/16 122/82  03/21/16 130/90    Wt Readings from Last 3 Encounters:  09/26/16 263 lb (119.3 kg)  07/01/16 263 lb (119.3 kg)  03/21/16 256 lb 4 oz (116.2 kg)    Physical Exam  Constitutional: He is oriented to person, place, and time.  HENT:  Mouth/Throat: Oropharynx is clear and moist. No oropharyngeal exudate.  Eyes: Conjunctivae are normal. Right eye exhibits no discharge. Left eye exhibits no discharge. No scleral icterus.  Neck: Normal range of motion. Neck supple. No JVD present. No tracheal deviation present. No thyromegaly present.  Cardiovascular: Normal rate, regular rhythm, normal heart sounds and intact distal pulses.  Exam reveals no gallop  and no friction rub.   No murmur heard. Pulmonary/Chest: Effort normal and breath sounds normal. No stridor. No respiratory distress. He has no wheezes. He has no rales. He exhibits no tenderness.  Abdominal: Soft. Bowel sounds are normal. He exhibits no distension and no mass. There is no tenderness. There is no rebound and no guarding.  Musculoskeletal: Normal range of motion. He exhibits no edema, tenderness or deformity.       Cervical back: He exhibits normal range of motion, no tenderness, no bony  tenderness, no swelling, no deformity, no pain and no spasm.  Lymphadenopathy:    He has no cervical adenopathy.  Neurological: He is alert and oriented to person, place, and time. He has normal reflexes. He displays no atrophy, no tremor and normal reflexes. No cranial nerve deficit or sensory deficit. He exhibits normal muscle tone. He displays no seizure activity. Coordination and gait normal.  Skin: Skin is warm and dry. No rash noted. He is not diaphoretic. No erythema.  Vitals reviewed.   Lab Results  Component Value Date   WBC 7.6 09/26/2016   HGB 15.6 09/26/2016   HCT 46.1 09/26/2016   PLT 252.0 09/26/2016   GLUCOSE 103 (H) 09/26/2016   CHOL 164 05/19/2014   TRIG 113.0 05/19/2014   HDL 28.30 (L) 05/19/2014   LDLDIRECT 87.9 11/30/2010   LDLCALC 113 (H) 05/19/2014   ALT 44 07/11/2015   AST 20 07/11/2015   NA 139 09/26/2016   K 4.2 09/26/2016   CL 104 09/26/2016   CREATININE 0.96 09/26/2016   BUN 13 09/26/2016   CO2 27 09/26/2016   TSH 2.38 09/26/2016   HGBA1C 5.6 09/26/2016    Dg Chest 2 View  Result Date: 09/07/2015 CLINICAL DATA:  Cough and shortness of breath for 4 days. EXAM: CHEST  2 VIEW COMPARISON:  02/26/2011 FINDINGS: The heart size and mediastinal contours are within normal limits. Both lungs are clear. The visualized skeletal structures are unremarkable. IMPRESSION: Negative.  No active cardiopulmonary disease. Electronically Signed   By: Myles Rosenthal M.D.   On: 09/07/2015 14:13    Assessment & Plan:   Ethan Cruz was seen today for hypertension and neck pain.  Diagnoses and all orders for this visit:  Essential hypertension- His blood pressure is well-controlled, electrolytes and renal function are normal -     CBC with Differential/Platelet; Future -     Basic metabolic panel; Future -     TSH; Future  Wry neck -     ibuprofen (ADVIL,MOTRIN) 600 MG tablet; Take 1 tablet (600 mg total) by mouth every 8 (eight) hours as needed. -     cyclobenzaprine (FEXMID)  7.5 MG tablet; Take 1 tablet (7.5 mg total) by mouth 3 (three) times daily as needed for muscle spasms.  Prediabetes- his A1c is at 5.6%, he doesn't need a medication but does agree to work on his lifestyle modifications. -     Basic metabolic panel; Future -     Hemoglobin A1c; Future   I have discontinued Mr. Fritsch's triamcinolone cream, TART CHERRY ADVANCED, methylPREDNISolone, azithromycin, sodium chloride, dextromethorphan-guaiFENesin, and benzonatate. I am also having him start on ibuprofen and cyclobenzaprine. Additionally, I am having him maintain his escitalopram, telmisartan, and TURMERIC PO.  Meds ordered this encounter  Medications  . ibuprofen (ADVIL,MOTRIN) 600 MG tablet    Sig: Take 1 tablet (600 mg total) by mouth every 8 (eight) hours as needed.    Dispense:  30 tablet    Refill:  0  . cyclobenzaprine (FEXMID) 7.5 MG tablet    Sig: Take 1 tablet (7.5 mg total) by mouth 3 (three) times daily as needed for muscle spasms.    Dispense:  30 tablet    Refill:  0     Follow-up: Return if symptoms worsen or fail to improve.  Sanda Linger, MD

## 2016-10-15 ENCOUNTER — Encounter: Payer: Self-pay | Admitting: Internal Medicine

## 2016-10-15 ENCOUNTER — Ambulatory Visit (INDEPENDENT_AMBULATORY_CARE_PROVIDER_SITE_OTHER): Payer: BC Managed Care – PPO | Admitting: Internal Medicine

## 2016-10-15 VITALS — BP 124/84 | HR 65 | Ht 74.0 in | Wt 261.0 lb

## 2016-10-15 DIAGNOSIS — N509 Disorder of male genital organs, unspecified: Secondary | ICD-10-CM | POA: Diagnosis not present

## 2016-10-15 DIAGNOSIS — F419 Anxiety disorder, unspecified: Secondary | ICD-10-CM | POA: Diagnosis not present

## 2016-10-15 DIAGNOSIS — R7303 Prediabetes: Secondary | ICD-10-CM | POA: Diagnosis not present

## 2016-10-15 DIAGNOSIS — N452 Orchitis: Secondary | ICD-10-CM

## 2016-10-15 DIAGNOSIS — N5089 Other specified disorders of the male genital organs: Secondary | ICD-10-CM

## 2016-10-15 MED ORDER — CIPROFLOXACIN HCL 500 MG PO TABS: 500.0000 mg | ORAL_TABLET | Freq: Two times a day (BID) | ORAL | 0 refills | Status: AC

## 2016-10-15 NOTE — Assessment & Plan Note (Signed)
Suspect benign, but will check testicle u/s out of abundance of caution due to age and exam, and is unlikely abscess

## 2016-10-15 NOTE — Assessment & Plan Note (Signed)
stable overall by history and exam, recent data reviewed with pt, and pt to continue medical treatment as before,  to f/u any worsening symptoms or concerns Lab Results  Component Value Date   HGBA1C 5.6 09/26/2016

## 2016-10-15 NOTE — Progress Notes (Signed)
Subjective:    Patient ID: Ethan Cruz, male    DOB: [birth year], 21 y.o.   MRN: [id number]  HPI Here with 3 days acute onset left testicle enlargement with at least mild tenderness, and became expecially concerned when he palpated a firm area to the left testicle in the shower.  No fever and Denies urinary symptoms such as dysuria, frequency, urgency, flank pain, hematuria or n/v, fever, chills. Not sexually active in past, denies any chance of STD and declines check today.  Pt denies chest pain, increased sob or doe, wheezing, orthopnea, PND, increased LE swelling, palpitations, dizziness or syncope.Denies worsening depressive symptoms, suicidal ideation, or panic; has ongoing anxiety, now much increased related to his symptoms, but no panic  Pt denies polydipsia, polyuria Past Medical History:  Diagnosis Date  . ADHD (attention deficit hyperactivity disorder)   . Allergic rhinitis, cause unspecified 09/19/2011  . Asthma 09/19/2011  . Auditory processing disorder   . Depression   . Dysgraphia   . Glucose intolerance (pre-diabetes) 2011  . Morbid obesity (HCC)    Past Surgical History:  Procedure Laterality Date  . NO PAST SURGERIES      reports that he has never smoked. He has never used smokeless tobacco. He reports that he does not drink alcohol or use drugs. family history includes Arthritis in his mother and other; Clotting disorder (age of onset: 5) in his mother; Diabetes in his mother and other; Heart disease in his other; Hypertension in his other; Ovarian cancer in his other; Stroke in his other. No Known Allergies Current Outpatient Prescriptions on File Prior to Visit  Medication Sig Dispense Refill  . cyclobenzaprine (FEXMID) 7.5 MG tablet Take 1 tablet (7.5 mg total) by mouth 3 (three) times daily as needed for muscle spasms. 30 tablet 0  . escitalopram (LEXAPRO) 20 MG tablet TAKE 1 TABLET(20 MG) BY MOUTH DAILY 90 tablet 2  . ibuprofen (ADVIL,MOTRIN) 600 MG tablet Take 1  tablet (600 mg total) by mouth every 8 (eight) hours as needed. 30 tablet 0  . TURMERIC PO Take by mouth.     No current facility-administered medications on file prior to visit.    Review of Systems  Constitutional: Negative for other unusual diaphoresis or sweats HENT: Negative for ear discharge or swelling Eyes: Negative for other worsening visual disturbances Respiratory: Negative for stridor or other swelling  Gastrointestinal: Negative for worsening distension or other blood Genitourinary: Negative for retention or other urinary change Musculoskeletal: Negative for other MSK pain or swelling Skin: Negative for color change or other new lesions Neurological: Negative for worsening tremors and other numbness  Psychiatric/Behavioral: Negative for worsening agitation or other fatigue All other system neg per pt    Objective:   Physical Exam BP 124/84   Pulse 65   Ht 6\' 2"  (1.88 m)   Wt 261 lb (118.4 kg)   SpO2 98%   BMI 33.51 kg/m  VS noted, not ill appearing Constitutional: Pt appears in NAD HENT: Head: NCAT.  Right Ear: External ear normal.  Left Ear: External ear normal.  Eyes: . Pupils are equal, round, and reactive to light. Conjunctivae and EOM are normal Nose: without d/c or deformity Neck: Neck supple. Gross normal ROM Cardiovascular: Normal rate and regular rhythm.   Pulmonary/Chest: Effort normal and breath sounds without rales or wheezing.  Abd:  Soft, NT, ND, + BS, no organomegaly, no flank tender GU: normal penis without swelling or d/c, left testicle with 1-2+ enlargement, mild tender,  with firm area to leftpost distal aspect - ? Vein enlargement vs mass but small < 1/3 cm; scrotum negative for redness/swelling or induration Neurological: Pt is alert. At baseline orientation, motor grossly intact Skin: Skin is warm. No rashes, other new lesions, no LE edema Psychiatric: Pt behavior is normal without agitation 1-2+ nervous No other exam findings      Assessment & Plan:

## 2016-10-15 NOTE — Assessment & Plan Note (Signed)
With situational worsening, seems reassured after exam without panic, cont lexapro

## 2016-10-15 NOTE — Progress Notes (Signed)
Pre visit review using our clinic review tool, if applicable. No additional management support is needed unless otherwise documented below in the visit note. 

## 2016-10-15 NOTE — Assessment & Plan Note (Signed)
Mild to mod, for antibx course,  to f/u any worsening symptoms or concerns 

## 2016-10-15 NOTE — Patient Instructions (Signed)
Please take all new medication as prescribed - the antibiotic  You will be contacted regarding the referral for: Testicle ultrasound  Please continue all other medications as before, and refills have been done if requested.  Please have the pharmacy call with any other refills you may need.  Please keep your appointments with your specialists as you may have planned

## 2016-10-16 ENCOUNTER — Other Ambulatory Visit: Payer: Self-pay | Admitting: Internal Medicine

## 2016-10-16 DIAGNOSIS — N5089 Other specified disorders of the male genital organs: Secondary | ICD-10-CM

## 2016-10-17 NOTE — Addendum Note (Signed)
Addended by: Verlan FriendsAIRRIKIER DAVIDSON, Hezekiah Veltre M on: 10/17/2016 02:32 PM   Modules accepted: Orders

## 2016-10-25 ENCOUNTER — Other Ambulatory Visit: Payer: BC Managed Care – PPO

## 2016-11-01 ENCOUNTER — Other Ambulatory Visit: Payer: BC Managed Care – PPO

## 2017-03-31 ENCOUNTER — Ambulatory Visit (INDEPENDENT_AMBULATORY_CARE_PROVIDER_SITE_OTHER): Payer: BC Managed Care – PPO

## 2017-03-31 DIAGNOSIS — Z23 Encounter for immunization: Secondary | ICD-10-CM

## 2017-06-20 ENCOUNTER — Encounter: Payer: Self-pay | Admitting: Internal Medicine

## 2017-06-20 ENCOUNTER — Ambulatory Visit (INDEPENDENT_AMBULATORY_CARE_PROVIDER_SITE_OTHER): Payer: BLUE CROSS/BLUE SHIELD | Admitting: Internal Medicine

## 2017-06-20 VITALS — BP 138/86 | HR 99 | Temp 98.5°F | Ht 74.0 in | Wt 246.0 lb

## 2017-06-20 DIAGNOSIS — J019 Acute sinusitis, unspecified: Secondary | ICD-10-CM | POA: Diagnosis not present

## 2017-06-20 DIAGNOSIS — J309 Allergic rhinitis, unspecified: Secondary | ICD-10-CM

## 2017-06-20 DIAGNOSIS — I10 Essential (primary) hypertension: Secondary | ICD-10-CM

## 2017-06-20 MED ORDER — AZITHROMYCIN 250 MG PO TABS: ORAL_TABLET | ORAL | 1 refills | Status: DC

## 2017-06-20 NOTE — Progress Notes (Signed)
Subjective:    Patient ID: Ethan Cruz, male    DOB: 02/16/1996, 22 y.o.   MRN: [id number]  HPI   Here with 4 days acute onset fever, facial pain, pressure, headache, general weakness and malaise, and greenish d/c, with mild ST and cough, but pt denies chest pain, wheezing, increased sob or doe, orthopnea, PND, increased LE swelling, palpitations, dizziness or syncope.  Pt denies new neurological symptoms such as new headache, or facial or extremity weakness or numbness   Pt denies polydipsia, polyuria.  Does have several wks ongoing nasal allergy symptoms with clearish congestion, itch and sneezing, without fever, pain, ST, cough, swelling or wheezing. Past Medical History:  Diagnosis Date  . ADHD (attention deficit hyperactivity disorder)   . Allergic rhinitis, cause unspecified 09/19/2011  . Asthma 09/19/2011  . Auditory processing disorder   . Depression   . Dysgraphia   . Glucose intolerance (pre-diabetes) 2011  . Morbid obesity (HCC)    Past Surgical History:  Procedure Laterality Date  . NO PAST SURGERIES      reports that  has never smoked. he has never used smokeless tobacco. He reports that he does not drink alcohol or use drugs. family history includes Arthritis in his mother and other; Clotting disorder (age of onset: 3240) in his mother; Diabetes in his mother and other; Heart disease in his other; Hypertension in his other; Ovarian cancer in his other; Stroke in his other. No Known Allergies Current Outpatient Medications on File Prior to Visit  Medication Sig Dispense Refill  . cyclobenzaprine (FEXMID) 7.5 MG tablet Take 1 tablet (7.5 mg total) by mouth 3 (three) times daily as needed for muscle spasms. 30 tablet 0  . escitalopram (LEXAPRO) 20 MG tablet TAKE 1 TABLET(20 MG) BY MOUTH DAILY 90 tablet 2  . ibuprofen (ADVIL,MOTRIN) 600 MG tablet Take 1 tablet (600 mg total) by mouth every 8 (eight) hours as needed. 30 tablet 0  . TURMERIC PO Take by mouth.     No current  facility-administered medications on file prior to visit.    Review of Systems  Constitutional: Negative for other unusual diaphoresis or sweats HENT: Negative for ear discharge or swelling Eyes: Negative for other worsening visual disturbances Respiratory: Negative for stridor or other swelling  Gastrointestinal: Negative for worsening distension or other blood Genitourinary: Negative for retention or other urinary change Musculoskeletal: Negative for other MSK pain or swelling Skin: Negative for color change or other new lesions Neurological: Negative for worsening tremors and other numbness  Psychiatric/Behavioral: Negative for worsening agitation or other fatigue All other system neg per pt    Objective:   Physical Exam BP 138/86   Pulse 99   Temp 98.5 F (36.9 C) (Oral)   Ht 6\' 2"  (1.88 m)   Wt 246 lb (111.6 kg)   SpO2 98%   BMI 31.58 kg/m  VS noted, mild ill Constitutional: Pt appears in NAD HENT: Head: NCAT.  Right Ear: External ear normal.  Left Ear: External ear normal.  Bilat tm's with mild erythema.  Max sinus areas mild tender.  Pharynx with mild erythema, no exudate Eyes: . Pupils are equal, round, and reactive to light. Conjunctivae and EOM are normal Nose: without d/c or deformity Neck: Neck supple. Gross normal ROM Cardiovascular: Normal rate and regular rhythm.   Pulmonary/Chest: Effort normal and breath sounds without rales or wheezing.  Neurological: Pt is alert. At baseline orientation, motor grossly intact Skin: Skin is warm. No rashes, other new lesions,  no LE edema Psychiatric: Pt behavior is normal without agitation  No other exam findings    Assessment & Plan:

## 2017-06-20 NOTE — Patient Instructions (Addendum)
Please take all new medication as prescribed - the antibiotic  You can also take Delsym OTC for cough, and/or Mucinex (or it's generic off brand) for congestion, and tylenol as needed for pain.  Please continue all other medications as before, and refills have been done if requested.  Please have the pharmacy call with any other refills you may need.  Please keep your appointments with your specialists as you may have planned    

## 2017-06-21 NOTE — Assessment & Plan Note (Signed)
Mild to mod, for antibx course,  to f/u any worsening symptoms or concerns 

## 2017-06-21 NOTE — Assessment & Plan Note (Signed)
D/w pt, to try OTC allegra and nasacort asd,  to f/u any worsening symptoms or concerns

## 2017-06-21 NOTE — Assessment & Plan Note (Signed)
stable overall by history and exam, recent data reviewed with pt, and pt to continue medical treatment as before,  to f/u any worsening symptoms or concerns BP Readings from Last 3 Encounters:  06/20/17 138/86  10/15/16 124/84  09/26/16 118/78

## 2017-08-05 ENCOUNTER — Ambulatory Visit: Payer: Self-pay | Admitting: *Deleted

## 2017-08-05 ENCOUNTER — Ambulatory Visit (INDEPENDENT_AMBULATORY_CARE_PROVIDER_SITE_OTHER): Payer: BLUE CROSS/BLUE SHIELD | Admitting: Family Medicine

## 2017-08-05 ENCOUNTER — Encounter: Payer: Self-pay | Admitting: Family Medicine

## 2017-08-05 VITALS — BP 121/78 | HR 66 | Temp 98.1°F | Ht 74.0 in | Wt 246.0 lb

## 2017-08-05 DIAGNOSIS — R6889 Other general symptoms and signs: Secondary | ICD-10-CM | POA: Diagnosis not present

## 2017-08-05 NOTE — Assessment & Plan Note (Signed)
Symptoms are likely viral in nature - counseled on supportive care - given indications to follow up.

## 2017-08-05 NOTE — Telephone Encounter (Addendum)
Pt called stating that he has body aches, nasal congestion last nigh t2/25/19 around 1930; today these symptoms continue and he has a worsening headache; he also says that he feels like he is in an oven; chest congestion and sometimes he feels like he can't get a breath "like when you step into a hot room"; the pt also states that he coughed up clear sputum and has clear nasal secretions; recommendations made per nurse triage to include seeing a physician within 4 hours; pt offered and accepted appointment with [facility] Clare [facility]remy  [facility] at 1540; pt verbalizes understanding.  Reason for Disposition . [1] MILD difficulty breathing (e.g., minimal/no SOB at rest, SOB with walking, pulse <100) AND [2] NEW-onset or WORSE than normal  Answer Assessment - Initial Assessment Questions 1. RESPIRATORY STATUS: "Describe your breathing?" (e.g., wheezing, shortness of breath, unable to speak, severe coughing)      occassional  shortness of breath 2. ONSET: "When did this breathing problem begin?"      08/05/17 3. PATTERN "Does the difficult breathing come and go, or has it been constant since it started?"     comes and goes 4. SEVERITY: "How bad is your breathing?" (e.g., mild, moderate, severe)    - MILD: No SOB at rest, mild SOB with walking, speaks normally in sentences, can lay down, no retractions, pulse < 100.    - MODERATE: SOB at rest, SOB with minimal exertion and prefers to sit, cannot lie down flat, speaks in phrases, mild retractions, audible wheezing, pulse 100-120.    - SEVERE: Very SOB at rest, speaks in single words, struggling to breathe, sitting hunched forward, retractions, pulse > 120      Mild to moerate 5. RECURRENT SYMPTOM: "Have you had difficulty breathing before?" If so, ask: "When was the last time?" and "What happened that time?"      no 6. CARDIAC HISTORY: "Do you have any history of heart disease?" (e.g., heart attack, angina, bypass surgery, angioplasty)      no 7. LUNG  HISTORY: "Do you have any history of lung disease?"  (e.g., pulmonary embolus, asthma, emphysema)     asthma 8. CAUSE: "What do you think is causing the breathing problem?"      unsure 9. OTHER SYMPTOMS: "Do you have any other symptoms? (e.g., dizziness, runny nose, cough, chest pain, fever)     Body aches, nasal congestion, dizziness, headache, chest congestion;  10. PREGNANCY: "Is there any chance you are pregnant?" "When was your last menstrual period?"       n/a 11. TRAVEL: "Have you traveled out of the country in the last month?" (e.g., travel history, exposures)       no  Protocols used: BREATHING DIFFICULTY-A-AH

## 2017-08-05 NOTE — Patient Instructions (Signed)
Please try things such as zyrtec-D or allegra-D which is an antihistamine and decongestant.   Please try afrin which will help with nasal congestion but use for only three days.   Please also try using a netti pot on a regular occasion.  Honey can help with a sore throat.   

## 2017-08-05 NOTE — Progress Notes (Signed)
AVIS MCMAHILL - 22 y.o. male MRN [id number]  Date of birth: Aug 19, 1995  SUBJECTIVE:  Including CC & ROS.  Chief Complaint  Patient presents with  . Generalized Body Aches    CIRO TASHIRO is a 22 y.o. male that is presenting with headaches and body aches. Symptoms have been ongoing for one day. Denies fevers or chills He does get the flu shot. He has been around coworkers with similar symptoms.  Review of Systems  Constitutional: Positive for chills. Negative for fever.  HENT: Negative for sinus pain.   Respiratory: Positive for cough.   Cardiovascular: Negative for chest pain.  Gastrointestinal: Negative for abdominal pain.  Musculoskeletal: Positive for myalgias.  Skin: Negative for color change.  Neurological: Positive for weakness.  Hematological: Negative for adenopathy.  Psychiatric/Behavioral: Negative for agitation.    HISTORY: Past Medical, Surgical, Social, and Family History Reviewed & Updated per EMR.   Pertinent Historical Findings include:  Past Medical History:  Diagnosis Date  . ADHD (attention deficit hyperactivity disorder)   . Allergic rhinitis, cause unspecified 09/19/2011  . Asthma 09/19/2011  . Auditory processing disorder   . Depression   . Dysgraphia   . Glucose intolerance (pre-diabetes) 2011  . Morbid obesity (HCC)     Past Surgical History:  Procedure Laterality Date  . NO PAST SURGERIES      No Known Allergies  Family History  Problem Relation Age of Onset  . Diabetes Mother   . Clotting disorder Mother 50       prothromb 2  . Arthritis Mother   . Arthritis Other        Parent & Grandparent  . Ovarian cancer Other        grandmother  . Heart disease Other        grandparent  . Stroke Other        grandparent  . Hypertension Other        grandparent  . Diabetes Other        grandparent     Social History   Socioeconomic History  . Marital status: Single    Spouse name: Not on file  . Number of children: Not on file  .  Years of education: Not on file  . Highest education level: Not on file  Social Needs  . Financial resource strain: Not on file  . Food insecurity - worry: Not on file  . Food insecurity - inability: Not on file  . Transportation needs - medical: Not on file  . Transportation needs - non-medical: Not on file  Occupational History  . Not on file  Tobacco Use  . Smoking status: Never Smoker  . Smokeless tobacco: Never Used  Substance and Sexual Activity  . Alcohol use: No  . Drug use: No  . Sexual activity: Not Currently    Birth control/protection: Abstinence  Other Topics Concern  . Not on file  Social History Narrative  . Not on file     PHYSICAL EXAM:  VS: BP 121/78 (BP Location: Left Arm, Patient Position: Sitting, Cuff Size: Normal)   Pulse 66   Temp 98.1 F (36.7 C) (Oral)   Ht 6\' 2"  (1.88 m)   Wt 246 lb (111.6 kg)   SpO2 97%   BMI 31.58 kg/m  Physical Exam Gen: NAD, alert, cooperative with exam,  ENT: normal lips, normal nasal mucosa, tympanic membranes clear and intact bilaterally, normal oropharynx, no cervical lymphadenopathy Eye: normal EOM, normal conjunctiva and lids CV:  no edema, +2 pedal pulses, regular rate and rhythm, S1-S2   Resp: no accessory muscle use, non-labored, clear to auscultation bilaterally, no crackles or wheezes  Skin: no rashes, no areas of induration  Neuro: normal tone, normal sensation to touch Psych:  normal insight, alert and oriented MSK: Normal gait, normal strength       ASSESSMENT & PLAN:   Flu-like symptoms Symptoms are likely viral in nature - counseled on supportive care - given indications to follow up.

## 2018-02-24 ENCOUNTER — Ambulatory Visit: Payer: BLUE CROSS/BLUE SHIELD

## 2018-02-26 ENCOUNTER — Ambulatory Visit: Payer: BLUE CROSS/BLUE SHIELD

## 2018-03-06 ENCOUNTER — Ambulatory Visit (INDEPENDENT_AMBULATORY_CARE_PROVIDER_SITE_OTHER): Payer: BLUE CROSS/BLUE SHIELD | Admitting: Internal Medicine

## 2018-03-06 ENCOUNTER — Encounter: Payer: Self-pay | Admitting: Internal Medicine

## 2018-03-06 VITALS — BP 122/68 | HR 68 | Temp 97.8°F | Ht 74.0 in | Wt 241.0 lb

## 2018-03-06 DIAGNOSIS — F419 Anxiety disorder, unspecified: Secondary | ICD-10-CM

## 2018-03-06 DIAGNOSIS — G43809 Other migraine, not intractable, without status migrainosus: Secondary | ICD-10-CM

## 2018-03-06 DIAGNOSIS — Z23 Encounter for immunization: Secondary | ICD-10-CM

## 2018-03-06 DIAGNOSIS — M25561 Pain in right knee: Secondary | ICD-10-CM

## 2018-03-06 DIAGNOSIS — G43909 Migraine, unspecified, not intractable, without status migrainosus: Secondary | ICD-10-CM | POA: Insufficient documentation

## 2018-03-06 DIAGNOSIS — G43009 Migraine without aura, not intractable, without status migrainosus: Secondary | ICD-10-CM | POA: Diagnosis not present

## 2018-03-06 DIAGNOSIS — M25562 Pain in left knee: Secondary | ICD-10-CM

## 2018-03-06 DIAGNOSIS — I10 Essential (primary) hypertension: Secondary | ICD-10-CM

## 2018-03-06 MED ORDER — SUMATRIPTAN SUCCINATE 100 MG PO TABS: 100.0000 mg | ORAL_TABLET | ORAL | 2 refills | Status: DC | PRN

## 2018-03-06 MED ORDER — DICLOFENAC SODIUM 2 % TD SOLN: TRANSDERMAL | 5 refills | Status: DC

## 2018-03-06 MED ORDER — DICLOFENAC SODIUM 1 % TD GEL: 2.0000 g | Freq: Four times a day (QID) | TRANSDERMAL | 5 refills | Status: DC

## 2018-03-06 NOTE — Assessment & Plan Note (Signed)
At this point is tolerable, no med tx needed or counseling per pt

## 2018-03-06 NOTE — Patient Instructions (Signed)
Please take all new medication as prescribed - the imitirex, and penssaid solution for the knees  Please continue all other medications as before, and refills have been done if requested.  Please have the pharmacy call with any other refills you may need.  Please continue your efforts at being more active, low cholesterol diet, and weight control.  Please keep your appointments with your specialists as you may have planned

## 2018-03-06 NOTE — Progress Notes (Signed)
Subjective:    Patient ID: Ethan Cruz, male    DOB: 1996/02/20, 22 y.o.   MRN: [id number]  HPI  Here to f/u with recurring HA's that stand out b/c so severe and increased frequency over the past 6 mo, gets blurry vision, cant read, photophobia, phonophobia, and nausea (no vomiting) , located different places, last to the right side, some better a bit with excedrin migraine but resolved,  Not sure if related to exertion, typically last 2-3 days before finally resolving.  First time for significant migraines this year compared to before.  Had a promotion to a new position at work early in the year, has been a bit more stressed.  Denies worsening depressive symptoms, suicidal ideation, or panic.  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  Did have recent glasses refraction update in July, no help.  Also c/o bilat knee aching anterior type of pain after standing without swelling or giveaways Past Medical History:  Diagnosis Date  . ADHD (attention deficit hyperactivity disorder)   . Allergic rhinitis, cause unspecified 09/19/2011  . Asthma 09/19/2011  . Auditory processing disorder   . Depression   . Dysgraphia   . Glucose intolerance (pre-diabetes) 2011  . Morbid obesity (HCC)    Past Surgical History:  Procedure Laterality Date  . NO PAST SURGERIES      reports that he has never smoked. He has never used smokeless tobacco. He reports that he does not drink alcohol or use drugs. family history includes Arthritis in his mother and other; Clotting disorder (age of onset: 39) in his mother; Diabetes in his mother and other; Heart disease in his other; Hypertension in his other; Ovarian cancer in his other; Stroke in his other. No Known Allergies No current outpatient medications on file prior to visit.   No current facility-administered medications on file prior to visit.    Review of  Systems  Constitutional: Negative for other unusual diaphoresis or sweats HENT: Negative for ear discharge or swelling Eyes: Negative for other worsening visual disturbances Respiratory: Negative for stridor or other swelling  Gastrointestinal: Negative for worsening distension or other blood Genitourinary: Negative for retention or other urinary change Musculoskeletal: Negative for other MSK pain or swelling Skin: Negative for color change or other new lesions Neurological: Negative for worsening tremors and other numbness  Psychiatric/Behavioral: Negative for worsening agitation or other fatigue All other system neg per pt    Objective:   Physical Exam BP 122/68   Pulse 68   Temp 97.8 F (36.6 C) (Oral)   Ht 6\' 2"  (1.88 m)   Wt 241 lb (109.3 kg)   SpO2 96%   BMI 30.94 kg/m  VS noted, not ill appearing  Constitutional: Pt appears in NAD HENT: Head: NCAT.  Right Ear: External ear normal.  Left Ear: External ear normal.  Eyes: . Pupils are equal, round, and reactive to light. Conjunctivae and EOM are normal Nose: without d/c or deformity Neck: Neck supple. Gross normal ROM Cardiovascular: Normal rate and regular rhythm.   Pulmonary/Chest: Effort normal and breath sounds without rales or wheezing.  Neurological: Pt is alert. At baseline orientation, motor grossly intact Skin: Skin is warm. No rashes, other new lesions, no LE edema Psychiatric: Pt behavior is normal without agitation , 1+ nervous No other exam findings  Lab Results  Component Value Date   WBC  7.6 09/26/2016   HGB 15.6 09/26/2016   HCT 46.1 09/26/2016   PLT 252.0 09/26/2016   GLUCOSE 103 (H) 09/26/2016   CHOL 164 05/19/2014   TRIG 113.0 05/19/2014   HDL 28.30 (L) 05/19/2014   LDLDIRECT 87.9 11/30/2010   LDLCALC 113 (H) 05/19/2014   ALT 44 07/11/2015   AST 20 07/11/2015   NA 139 09/26/2016   K 4.2 09/26/2016   CL 104 09/26/2016   CREATININE 0.96 09/26/2016   BUN 13 09/26/2016   CO2 27 09/26/2016    TSH 2.38 09/26/2016   HGBA1C 5.6 09/26/2016       Assessment & Plan:

## 2018-03-06 NOTE — Assessment & Plan Note (Signed)
Ok for imitrex prn,  to f/u any worsening symptoms or concerns  

## 2018-03-06 NOTE — Assessment & Plan Note (Signed)
stable overall by history and exam, recent data reviewed with pt, and pt to continue medical treatment as before,  to f/u any worsening symptoms or concerns  

## 2018-03-12 ENCOUNTER — Ambulatory Visit (INDEPENDENT_AMBULATORY_CARE_PROVIDER_SITE_OTHER): Payer: BLUE CROSS/BLUE SHIELD

## 2018-03-12 DIAGNOSIS — H6123 Impacted cerumen, bilateral: Secondary | ICD-10-CM

## 2018-07-25 ENCOUNTER — Encounter: Payer: Self-pay | Admitting: Family Medicine

## 2018-07-25 ENCOUNTER — Ambulatory Visit (INDEPENDENT_AMBULATORY_CARE_PROVIDER_SITE_OTHER): Payer: BLUE CROSS/BLUE SHIELD | Admitting: Family Medicine

## 2018-07-25 VITALS — BP 122/84 | HR 81 | Temp 98.0°F | Resp 16 | Ht 74.0 in | Wt 259.5 lb

## 2018-07-25 DIAGNOSIS — G43009 Migraine without aura, not intractable, without status migrainosus: Secondary | ICD-10-CM | POA: Diagnosis not present

## 2018-07-25 MED ORDER — RIZATRIPTAN BENZOATE 10 MG PO TABS: 10.0000 mg | ORAL_TABLET | ORAL | 0 refills | Status: DC | PRN

## 2018-07-25 NOTE — Patient Instructions (Addendum)
  Use maxalt as needed for headache. Prior to maxalt you can try a dose of 400-600 mg of ibuprofen or Excedrin.   Migraine Headache  A migraine headache is a very strong throbbing pain on one side or both sides of your head. Migraines can also cause other symptoms. Talk with your doctor about what things may bring on (trigger) your migraine headaches. Follow these instructions at home: Medicines  Take over-the-counter and prescription medicines only as told by your doctor.  Do not drive or use heavy machinery while taking prescription pain medicine.  To prevent or treat constipation while you are taking prescription pain medicine, your doctor may recommend that you: ? Drink enough fluid to keep your pee (urine) clear or pale yellow. ? Take over-the-counter or prescription medicines. ? Eat foods that are high in fiber. These include fresh fruits and vegetables, whole grains, and beans. ? Limit foods that are high in fat and processed sugars. These include fried and sweet foods. Lifestyle  Avoid alcohol.  Do not use any products that contain nicotine or tobacco, such as cigarettes and e-cigarettes. If you need help quitting, ask your doctor.  Get at least 8 hours of sleep every night.  Limit your stress. General instructions   Keep a journal to find out what may bring on your migraines. For example, write down: ? What you eat and drink. ? How much sleep you get. ? Any change in what you eat or drink. ? Any change in your medicines.  If you have a migraine: ? Avoid things that make your symptoms worse, such as bright lights. ? It may help to lie down in a dark, quiet room. ? Do not drive or use heavy machinery. ? Ask your doctor what activities are safe for you.  Keep all follow-up visits as told by your doctor. This is important. Contact a doctor if:  You get a migraine that is different or worse than your usual migraines. Get help right away if:  Your migraine gets very  bad.  You have a fever.  You have a stiff neck.  You have trouble seeing.  Your muscles feel weak or like you cannot control them.  You start to lose your balance a lot.  You start to have trouble walking.  You pass out (faint). This information is not intended to replace advice given to you by your health care provider. Make sure you discuss any questions you have with your health care provider. Document Released: 03/05/2008 Document Revised: 02/18/2018 Document Reviewed: 11/13/2015 Elsevier Interactive Patient Education  2019 [employer].

## 2018-07-25 NOTE — Progress Notes (Signed)
Subjective:    Patient ID: Ethan Cruz, male    DOB: Jun 09, 1996, 23 y.o.   MRN: [id number]  HPI  Patient presents to clinic complaining of migraine headache.  Pain began this morning, mainly on the right side of head.  States this pain is similar to previous migraines he has had in the past and is not the worst headache of his life.  Denies any loss of vision, denies any one-sided facial weakness, denies any one-sided extremity weakness.  Does report some light sensitivity, but no sensitivity to sound and no nausea.  States he did take 3 Advil this morning totaling 600 mg which did help take the edge off, but pain is still aching.  States in the past he has used Imitrex, but when he took this medication it made his body feel achy all over and he would prefer not to have to take Imitrex again.  No fever or chills.  No cough, chest pain, shortness of breath or wheezing.   Patient Active Problem List   Diagnosis Date Noted  . Migraine 03/06/2018  . Bilateral knee pain 03/06/2018  . Flu-like symptoms 08/05/2017  . Acute sinus infection 06/20/2017  . Orchitis of left testicle 10/15/2016  . Mass of left testicle 10/15/2016  . Anxiety 10/15/2016  . Wry neck 09/26/2016  . Essential hypertension 09/07/2015  . 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 Migraine without aura and without status migrainosus, not intractable 11/01/2014  . Routine general medical examination at a health care facility 12/13/2013  . Allergic rhinitis 09/19/2011  . Prediabetes   . Morbid obesity (HCC) 11/30/2010  . ADHD (attention deficit hyperactivity disorder)     Social History   Tobacco Use  . Smoking status: Never Smoker  . Smokeless tobacco: Never Used  Substance Use Topics  . Alcohol use: No    Review of Systems   Constitutional: Negative for chills, and fever. +fatigue HENT: Negative for congestion, ear pain, sinus pain and sore throat.   Eyes: Negative.   Respiratory: Negative for cough,  shortness of breath and wheezing.   Cardiovascular: Negative for chest pain, palpitations and leg swelling.  Gastrointestinal: Negative for abdominal pain, diarrhea, nausea and vomiting.  Genitourinary: Negative for dysuria, frequency and urgency.  Musculoskeletal: Negative for arthralgias and myalgias.  Skin: Negative for color change, pallor and rash.  Neurological: Negative for syncope, light-headedness. +migraine headache Psychiatric/Behavioral: The patient is not nervous/anxious.      Objective:   Physical Exam Vitals signs and nursing note reviewed.  Constitutional:      General: He is not in acute distress.    Appearance: He is not toxic-appearing or diaphoretic.  HENT:     Head: Normocephalic and atraumatic.     Right Ear: Tympanic membrane, ear canal and external ear normal.     Left Ear: Tympanic membrane, ear canal and external ear normal.     Nose: Congestion and rhinorrhea (mild nasal congestion with clear drainage) present.     Mouth/Throat:     Mouth: Mucous membranes are moist.     Pharynx: Oropharynx is clear.  Eyes:     General: No scleral icterus.    Extraocular Movements: Extraocular movements intact.     Conjunctiva/sclera: Conjunctivae normal.     Pupils: Pupils are equal, round, and reactive to light.  Neck:     Musculoskeletal: Normal range of motion and neck supple. No neck rigidity.  Cardiovascular:     Rate and Rhythm: Normal rate  and regular rhythm.     Heart sounds: Normal heart sounds.  Pulmonary:     Effort: Pulmonary effort is normal. No respiratory distress.     Breath sounds: Normal breath sounds.  Lymphadenopathy:     Cervical: No cervical adenopathy.  Skin:    General: Skin is warm and dry.     Coloration: Skin is not jaundiced or pale.  Neurological:     Mental Status: He is alert and oriented to person, place, and time.     Gait: Gait normal.     Comments: Speech clear.  Smile symmetrical.  Able to raise eyebrows, puff out cheeks,  clench teeth without issue.  Grips equal and strong.  Psychiatric:        Mood and Affect: Mood normal.        Behavior: Behavior normal.        Thought Content: Thought content normal.    Vitals:   07/25/18 1128  BP: 122/84  Pulse: 81  Resp: 16  Temp: 98 F (36.7 C)  SpO2: 96%       Assessment & Plan:    Migraine- patient's symptoms and exam do appear consistent with a migraine.  Patient would prefer not to have a injection in clinic due to fear of needles.  Discussed different migraine treatment options.  Patient is interested in trying a different triptan medication as abortive therapy for his migraines.  We will try Maxalt 10 mg as needed for migraine.  Also advised he can try over-the-counter Excedrin if he is feeling a migraine come on.  Advised to get plenty of rest, drink plenty of fluids, eat a balanced diet and get proper sleep.  Strict return precautions given, advised if he develops any worsening headache, changes in neurological symptoms including speech difficulty, visual changes, extremity weakness, facial droop, dizziness, feeling foggy to call clinic and or go to the emergency room right away for evaluation  Patient will otherwise keep his regularly scheduled follow-up with PCP as planned advised to return to clinic sooner if any issues arise.

## 2018-12-28 ENCOUNTER — Ambulatory Visit: Payer: BLUE CROSS/BLUE SHIELD | Admitting: Internal Medicine

## 2018-12-29 ENCOUNTER — Encounter: Payer: Self-pay | Admitting: Internal Medicine

## 2018-12-29 ENCOUNTER — Ambulatory Visit (INDEPENDENT_AMBULATORY_CARE_PROVIDER_SITE_OTHER)
Admission: RE | Admit: 2018-12-29 | Discharge: 2018-12-29 | Disposition: A | Discharge status: Home / Self Care | Payer: BC Managed Care – PPO | Source: Ambulatory Visit | Attending: Internal Medicine | Admitting: Internal Medicine

## 2018-12-29 ENCOUNTER — Other Ambulatory Visit: Payer: Self-pay

## 2018-12-29 ENCOUNTER — Ambulatory Visit (INDEPENDENT_AMBULATORY_CARE_PROVIDER_SITE_OTHER): Payer: BC Managed Care – PPO | Admitting: Internal Medicine

## 2018-12-29 VITALS — BP 120/80 | HR 84 | Temp 98.7°F | Ht 74.0 in | Wt 259.0 lb

## 2018-12-29 DIAGNOSIS — S9782XA Crushing injury of left foot, initial encounter: Secondary | ICD-10-CM

## 2018-12-29 NOTE — Patient Instructions (Signed)
Foot Contusion A foot contusion is a deep bruise to the foot. Deep bruises happen when an injury causes bleeding under the skin. The skin over the bruise may turn blue, purple, or yellow. Minor injuries will cause a bruise that is painless. Deep bruises that are worse may stay painful and swollen for a few weeks. What are the causes? This condition is most often caused by a hard hit or direct force to your foot, such as having a heavy object fall on your foot. What are the signs or symptoms?   Swelling of the foot.  Pain and tenderness of the foot.  Changes in the color of the foot. The area may have redness and then turn blue, purple, or yellow. How is this treated? In general, the best treatment for a foot contusion is rest, ice, pressure (compression), and elevation. This is often called RICE therapy. An elastic wrap may be recommended to support your foot.  Your doctor may also suggest over-the-counter medicines for pain control. If your swelling or pain is very bad, you may be given crutches. Follow these instructions at home: RICE therapy   Rest the injured area. Try to avoid standing or walking while your foot hurts.  If told, put ice on the injured area: ? Put ice in a plastic bag. ? Place a towel between your skin and the bag. ? Leave the ice on for 20 minutes, 2-3 times a day.  If told, put light pressure on the injured area using an elastic wrap. ? Make sure the wrap is not too tight. If your toes turn numb, cold, or blue, take the wrap off and put it back on more loosely. ? Remove and put the wrap back on as told by your doctor.  Raise (elevate) the injured area above the level of your heart while you are sitting or lying down. General instructions  Take over-the-counter and prescription medicines only as told by your doctor.  Use crutches as told by your doctor, if this applies.  Do not use any products that contain nicotine or tobacco, such as cigarettes,  e-cigarettes, and chewing tobacco. These can delay healing. If you need help quitting, ask your doctor.  Keep all follow-up visits as told by your doctor. This is important. Contact a doctor if:  Your symptoms do not get better after many days of treatment.  You have redness, swelling, or pain in your foot or toes.  You have trouble moving the injured area.  Medicine does not help your swelling or pain. Get help right away if:  You have very bad pain.  Your foot or toes are numb.  Your foot or toes turn very light (pale) or cold.  You cannot move your foot or ankle.  Your foot feels warm when you touch it. Summary  A foot contusion is a deep bruise to the foot.  This condition is most often caused by a hard hit or direct force to your foot.  Symptoms include swelling, pain, and color changes in the injured area.  In general, the best treatment for a foot contusion is rest, ice, pressure (compression), and elevation. This information is not intended to replace advice given to you by your health care provider. Make sure you discuss any questions you have with your health care provider. Document Released: 03/05/2008 Document Revised: 01/28/2018 Document Reviewed: 01/28/2018 Elsevier Patient Education  2020 [employer].

## 2018-12-29 NOTE — Progress Notes (Signed)
Subjective:  Patient ID: Ethan Cruz, male    DOB: Aug 14, 1995  Age: 23 y.o. MRN: [id number]  CC: Foot Injury   HPI Ethan Cruz presents for concerns about his left foot.  He describes some kind of a crush injury about 3 weeks ago.  He initially had bruising, pain, and swelling.  He took ibuprofen and elevated his foot and the symptoms have resolved.  His only symptom now is mild tingling on the dorsum of the foot that extends into the second toe.  Outpatient Medications Prior to Visit  Medication Sig Dispense Refill  . ibuprofen (ADVIL,MOTRIN) 200 MG tablet Take 200 mg by mouth every 6 (six) hours as needed.     No facility-administered medications prior to visit.     ROS Review of Systems  Constitutional: Negative.  Negative for chills and fever.  HENT: Negative.   Eyes: Negative.   Respiratory: Negative.   Cardiovascular: Negative for chest pain and leg swelling.  Gastrointestinal: Negative for abdominal pain.  Endocrine: Negative.   Genitourinary: Negative.   Musculoskeletal: Positive for arthralgias.  Skin: Negative for color change, pallor and wound.  Neurological: Negative for dizziness, weakness and numbness.  Hematological: Negative for adenopathy. Does not bruise/bleed easily.  Psychiatric/Behavioral: Negative.     Objective:  BP 120/80 (BP Location: Left Arm, Patient Position: Sitting, Cuff Size: Large)   Pulse 84   Temp 98.7 F (37.1 C) (Oral)   Ht 6\' 2"  (1.88 m)   Wt 259 lb (117.5 kg)   SpO2 97%   BMI 33.25 kg/m   BP Readings from Last 3 Encounters:  12/29/18 120/80  07/25/18 122/84  03/06/18 122/68    Wt Readings from Last 3 Encounters:  12/29/18 259 lb (117.5 kg)  07/25/18 259 lb 8 oz (117.7 kg)  03/06/18 241 lb (109.3 kg)    Physical Exam Cardiovascular:     Pulses:          Dorsalis pedis pulses are 1+ on the left side.       Posterior tibial pulses are 1+ on the left side.  Musculoskeletal:     Left foot: Normal range of motion and  normal capillary refill. No tenderness, bony tenderness, swelling, crepitus, deformity, laceration or foot drop.  Feet:     Left foot:     Skin integrity: Skin integrity normal. No ulcer, skin breakdown, erythema or warmth.     Lab Results  Component Value Date   WBC 7.6 09/26/2016   HGB 15.6 09/26/2016   HCT 46.1 09/26/2016   PLT 252.0 09/26/2016   GLUCOSE 103 (H) 09/26/2016   CHOL 164 05/19/2014   TRIG 113.0 05/19/2014   HDL 28.30 (L) 05/19/2014   LDLDIRECT 87.9 11/30/2010   LDLCALC 113 (H) 05/19/2014   ALT 44 07/11/2015   AST 20 07/11/2015   NA 139 09/26/2016   K 4.2 09/26/2016   CL 104 09/26/2016   CREATININE 0.96 09/26/2016   BUN 13 09/26/2016   CO2 27 09/26/2016   TSH 2.38 09/26/2016   HGBA1C 5.6 09/26/2016    Dg Chest 2 View  Result Date: 09/07/2015 CLINICAL DATA:  Cough and shortness of breath for 4 days. EXAM: CHEST  2 VIEW COMPARISON:  02/26/2011 FINDINGS: The heart size and mediastinal contours are within normal limits. Both lungs are clear. The visualized skeletal structures are unremarkable. IMPRESSION: Negative.  No active cardiopulmonary disease. Electronically Signed   By: Earle Gell M.D.   On: 09/07/2015 14:13    Dg  Foot Complete Left  Result Date: 12/29/2018 CLINICAL DATA:  Crush injury 3 weeks ago. EXAM: LEFT FOOT - COMPLETE 3+ VIEW COMPARISON:  None. FINDINGS: Osseous alignment is normal. No fracture line or displaced fracture fragment seen. Soft tissues about the foot are unremarkable. IMPRESSION: Negative. Electronically Signed   By: Bary RichardStan  Maynard M.D.   On: 12/29/2018 16:33    Assessment & Plan:   Ethan Cruz was seen today for foot injury.  Diagnoses and all orders for this visit:  Crush injury of left foot, initial encounter- He complains of tingling at the site of a crush injury that occurred 3 weeks ago.  Examination today is normal.  Plain films are normal.  His only remaining symptom is tingling.  I think he has a nerve contusion.  He does not  want a pursue any additional diagnostic or treatment options at this time.  He will let me know if develops any new or worsening symptoms. -     DG Foot Complete Left; Future   I am having Ethan Cruz maintain his ibuprofen.  No orders of the defined types were placed in this encounter.    Follow-up: No follow-ups on file.  Sanda Lingerhomas Beryl Balz, MD

## 2019-02-11 ENCOUNTER — Ambulatory Visit (INDEPENDENT_AMBULATORY_CARE_PROVIDER_SITE_OTHER): Payer: BC Managed Care – PPO | Admitting: Internal Medicine

## 2019-02-11 ENCOUNTER — Other Ambulatory Visit: Payer: Self-pay

## 2019-02-11 ENCOUNTER — Encounter: Payer: Self-pay | Admitting: Internal Medicine

## 2019-02-11 ENCOUNTER — Other Ambulatory Visit (INDEPENDENT_AMBULATORY_CARE_PROVIDER_SITE_OTHER): Payer: BC Managed Care – PPO

## 2019-02-11 VITALS — BP 142/92 | HR 60 | Temp 98.0°F | Resp 16 | Ht 74.0 in | Wt 250.0 lb

## 2019-02-11 DIAGNOSIS — I10 Essential (primary) hypertension: Secondary | ICD-10-CM | POA: Diagnosis not present

## 2019-02-11 DIAGNOSIS — Z0000 Encounter for general adult medical examination without abnormal findings: Secondary | ICD-10-CM

## 2019-02-11 DIAGNOSIS — K2100 Gastro-esophageal reflux disease with esophagitis, without bleeding: Secondary | ICD-10-CM | POA: Insufficient documentation

## 2019-02-11 DIAGNOSIS — Z23 Encounter for immunization: Secondary | ICD-10-CM | POA: Diagnosis not present

## 2019-02-11 DIAGNOSIS — R7303 Prediabetes: Secondary | ICD-10-CM

## 2019-02-11 DIAGNOSIS — F418 Other specified anxiety disorders: Secondary | ICD-10-CM | POA: Diagnosis not present

## 2019-02-11 LAB — URINALYSIS, ROUTINE W REFLEX MICROSCOPIC
Bilirubin Urine: NEGATIVE
Hgb urine dipstick: NEGATIVE
Ketones, ur: NEGATIVE
Leukocytes,Ua: NEGATIVE
Nitrite: NEGATIVE
RBC / HPF: NONE SEEN (ref 0–?)
Specific Gravity, Urine: 1.02 (ref 1.000–1.030)
Urine Glucose: NEGATIVE
Urobilinogen, UA: 0.2 (ref 0.0–1.0)
pH: 7.5 (ref 5.0–8.0)

## 2019-02-11 LAB — BASIC METABOLIC PANEL
BUN: 9 mg/dL (ref 6–23)
CO2: 28 mEq/L (ref 19–32)
Calcium: 9.7 mg/dL (ref 8.4–10.5)
Chloride: 105 mEq/L (ref 96–112)
Creatinine, Ser: 0.99 mg/dL (ref 0.40–1.50)
GFR: 93.59 mL/min (ref >60.00)
Glucose, Bld: 96 mg/dL (ref 70–99)
Potassium: 4.2 mEq/L (ref 3.5–5.1)
Sodium: 142 mEq/L (ref 135–145)

## 2019-02-11 LAB — HEMOGLOBIN A1C: Hgb A1c MFr Bld: 5.3 % (ref 4.6–6.5)

## 2019-02-11 LAB — LIPID PANEL
Cholesterol: 138 mg/dL (ref 0–200)
HDL: 31.7 mg/dL — ABNORMAL LOW (ref >39.00)
LDL Cholesterol: 80 mg/dL (ref 0–99)
NonHDL: 106.65
Total CHOL/HDL Ratio: 4
Triglycerides: 132 mg/dL (ref 0.0–149.0)
VLDL: 26.4 mg/dL (ref 0.0–40.0)

## 2019-02-11 LAB — CBC WITH DIFFERENTIAL/PLATELET
Basophils Absolute: 0.1 10*3/uL (ref 0.0–0.1)
Basophils Relative: 0.7 % (ref 0.0–3.0)
Eosinophils Absolute: 0.2 10*3/uL (ref 0.0–0.7)
Eosinophils Relative: 2.1 % (ref 0.0–5.0)
HCT: 49.5 % (ref 39.0–52.0)
Hemoglobin: 16.6 g/dL (ref 13.0–17.0)
Lymphocytes Relative: 21 % (ref 12.0–46.0)
Lymphs Abs: 1.6 10*3/uL (ref 0.7–4.0)
MCHC: 33.6 g/dL (ref 30.0–36.0)
MCV: 90.5 fl (ref 78.0–100.0)
Monocytes Absolute: 0.7 10*3/uL (ref 0.1–1.0)
Monocytes Relative: 9 % (ref 3.0–12.0)
Neutro Abs: 5.1 10*3/uL (ref 1.4–7.7)
Neutrophils Relative %: 67.2 % (ref 43.0–77.0)
Platelets: 245 10*3/uL (ref 150.0–400.0)
RBC: 5.47 Mil/uL (ref 4.22–5.81)
RDW: 13.4 % (ref 11.5–15.5)
WBC: 7.6 10*3/uL (ref 4.0–10.5)

## 2019-02-11 LAB — TSH: TSH: 1.27 u[IU]/mL (ref 0.35–4.50)

## 2019-02-11 LAB — HEPATIC FUNCTION PANEL
ALT: 39 U/L (ref 0–53)
AST: 18 U/L (ref 0–37)
Albumin: 4.7 g/dL (ref 3.5–5.2)
Alkaline Phosphatase: 73 U/L (ref 39–117)
Bilirubin, Direct: 0.2 mg/dL (ref 0.0–0.3)
Total Bilirubin: 1.1 mg/dL (ref 0.2–1.2)
Total Protein: 7.5 g/dL (ref 6.0–8.3)

## 2019-02-11 MED ORDER — ESCITALOPRAM OXALATE 10 MG PO TABS: 10.0000 mg | ORAL_TABLET | Freq: Every day | ORAL | 0 refills | Status: DC

## 2019-02-11 MED ORDER — IRBESARTAN 150 MG PO TABS: 150.0000 mg | ORAL_TABLET | Freq: Every day | ORAL | 1 refills | Status: DC

## 2019-02-11 MED ORDER — VIIBRYD STARTER PACK 10 & 20 MG PO KIT: 1.0000 | PACK | Freq: Every day | ORAL | 0 refills | Status: DC

## 2019-02-11 MED ORDER — OMEPRAZOLE 40 MG PO CPDR: 40.0000 mg | DELAYED_RELEASE_CAPSULE | Freq: Every day | ORAL | 1 refills | Status: DC

## 2019-02-11 NOTE — Patient Instructions (Signed)

## 2019-02-11 NOTE — Progress Notes (Signed)
Subjective:  Patient ID: Ethan Cruz, male    DOB: 11/09/1995  Age: 23 y.o. MRN: [id number]  CC: Annual Exam, Hypertension, Depression, and Gastroesophageal Reflux   HPI JUANDAVID DALLMAN presents for a CPX.  He complains of a several month history of worsening heartburn.  He was not getting much symptom relief with TUMS so he tried his mother's supply of omeprazole and said it helped quite a bit.  He denies odynophagia or dysphagia.  He complains of a several month history of having multiple stressors.  He complains of nervousness, anxiety, insomnia, weight loss, loss of appetite, feeling hopeless, and feeling helpless.  He denies SI or HI.  Outpatient Medications Prior to Visit  Medication Sig Dispense Refill  . ibuprofen (ADVIL,MOTRIN) 200 MG tablet Take 200 mg by mouth every 6 (six) hours as needed.     No facility-administered medications prior to visit.     ROS Review of Systems  Constitutional: Positive for appetite change and unexpected weight change. Negative for diaphoresis and fatigue.  HENT: Negative.  Negative for sore throat, trouble swallowing and voice change.   Eyes: Negative for visual disturbance.  Respiratory: Negative for apnea, cough, chest tightness, shortness of breath and wheezing.   Cardiovascular: Negative for chest pain, palpitations and leg swelling.  Gastrointestinal: Negative for abdominal pain, blood in stool, constipation, diarrhea, nausea and vomiting.  Endocrine: Negative.   Genitourinary: Negative.  Negative for difficulty urinating, discharge, dysuria, hematuria, penile swelling, scrotal swelling and testicular pain.  Musculoskeletal: Negative.  Negative for arthralgias.  Skin: Negative.  Negative for color change.  Neurological: Negative.  Negative for dizziness, weakness, light-headedness and headaches.  Hematological: Negative for adenopathy. Does not bruise/bleed easily.  Psychiatric/Behavioral: Positive for dysphoric mood and sleep  disturbance. Negative for agitation, behavioral problems, confusion, decreased concentration, hallucinations, self-injury and suicidal ideas. The patient is nervous/anxious. The patient is not hyperactive.     Objective:  BP (!) 142/92 (BP Location: Left Arm, Patient Position: Sitting, Cuff Size: Large)   Pulse 60   Temp 98 F (36.7 C) (Oral)   Resp 16   Ht _0  (1.88 m)   Wt 250 lb (113.4 kg)   SpO2 98%   BMI 32.10 kg/m   BP Readings from Last 3 Encounters:  02/11/19 (!) 142/92  12/29/18 120/80  07/25/18 122/84    Wt Readings from Last 3 Encounters:  02/11/19 250 lb (113.4 kg)  12/29/18 259 lb (117.5 kg)  07/25/18 259 lb 8 oz (117.7 kg)    Physical Exam Vitals signs reviewed.  Constitutional:      Appearance: He is obese. He is not ill-appearing or diaphoretic.  HENT:     Nose: Nose normal.     Mouth/Throat:     Mouth: Mucous membranes are moist.  Eyes:     General: No scleral icterus.    Conjunctiva/sclera: Conjunctivae normal.  Neck:     Musculoskeletal: Normal range of motion and neck supple. No neck rigidity.  Cardiovascular:     Rate and Rhythm: Regular rhythm. Bradycardia present.     Heart sounds: No murmur. No gallop.      Comments: EKG ----  Sinus  Bradycardia  WITHIN NORMAL LIMITS  Pulmonary:     Effort: Pulmonary effort is normal.     Breath sounds: No stridor. No wheezing, rhonchi or rales.  Abdominal:     General: Abdomen is flat. Bowel sounds are normal. There is no distension.     Palpations: Abdomen is soft.  There is no hepatomegaly, splenomegaly or mass.     Tenderness: There is no abdominal tenderness.     Hernia: No hernia is present.  Musculoskeletal: Normal range of motion.  Skin:    General: Skin is warm.     Findings: No lesion or rash.  Neurological:     General: No focal deficit present.     Mental Status: He is alert and oriented to person, place, and time. Mental status is at baseline.  Psychiatric:        Attention and  Perception: Attention and perception normal.        Mood and Affect: Mood is anxious and depressed. Mood is not elated. Affect is not flat.        Speech: Speech normal.        Behavior: Behavior normal. Behavior is not agitated, slowed, aggressive or withdrawn. Behavior is cooperative.        Thought Content: Thought content normal. Thought content is not paranoid or delusional. Thought content does not include homicidal or suicidal ideation. Thought content does not include homicidal or suicidal plan.        Cognition and Memory: Cognition normal.        Judgment: Judgment normal.     Lab Results  Component Value Date   WBC 7.6 02/11/2019   HGB 16.6 02/11/2019   HCT 49.5 02/11/2019   PLT 245.0 02/11/2019   GLUCOSE 96 02/11/2019   CHOL 138 02/11/2019   TRIG 132.0 02/11/2019   HDL 31.70 (L) 02/11/2019   LDLDIRECT 87.9 11/30/2010   LDLCALC 80 02/11/2019   ALT 39 02/11/2019   AST 18 02/11/2019   NA 142 02/11/2019   K 4.2 02/11/2019   CL 105 02/11/2019   CREATININE 0.99 02/11/2019   BUN 9 02/11/2019   CO2 28 02/11/2019   TSH 1.27 02/11/2019   HGBA1C 5.3 02/11/2019    Dg Foot Complete Left  Result Date: 12/29/2018 CLINICAL DATA:  Crush injury 3 weeks ago. EXAM: LEFT FOOT - COMPLETE 3+ VIEW COMPARISON:  None. FINDINGS: Osseous alignment is normal. No fracture line or displaced fracture fragment seen. Soft tissues about the foot are unremarkable. IMPRESSION: Negative. Electronically Signed   By: Franki Cabot M.D.   On: 12/29/2018 16:33    Assessment & Plan:   Jarvin was seen today for annual exam, hypertension, depression and gastroesophageal reflux.  Diagnoses and all orders for this visit:  Essential hypertension-his blood pressure is not adequately well controlled.  His labs are negative for secondary causes or endorgan damage.  His EKG is negative for LVH.  In addition to lifestyle modifications of asked him to start taking an ARB. -     CBC with Differential/Platelet;  Future -     Basic metabolic panel; Future -     TSH; Future -     Urinalysis, Routine w reflex microscopic; Future -     Hepatic function panel; Future -     EKG 12-Lead -     irbesartan (AVAPRO) 150 MG tablet; Take 1 tablet (150 mg total) by mouth daily.  Prediabetes-his blood sugars are normal now. -     Hemoglobin A1c; Future  Routine general medical examination at a health care facility-exam completed, labs reviewed, vaccines reviewed and updated, patient education was given. -     Lipid panel; Future -     HIV Antibody (routine testing w rflx); Future  GERD with esophagitis -     omeprazole (PRILOSEC) 40 MG capsule;  Take 1 capsule (40 mg total) by mouth daily.  Depression with anxiety- I had initially recommended vialzodone to treat this but he is concerned about the GI side effects so will start escitalopram at 10 mg a day.  I anticipate increasing the dose over the next few months. -     Discontinue: Vilazodone HCl (VIIBRYD STARTER PACK) 10 & 20 MG KIT; Take 1 tablet by mouth daily. -     escitalopram (LEXAPRO) 10 MG tablet; Take 1 tablet (10 mg total) by mouth daily.  Need for influenza vaccination -     Flu Vaccine QUAD 36+ mos IM   I have discontinued Luay Balding Broom. Herrin's ibuprofen and Viibryd Starter Pack. I am also having him start on omeprazole, escitalopram, and irbesartan.  Meds ordered this encounter  Medications  . omeprazole (PRILOSEC) 40 MG capsule    Sig: Take 1 capsule (40 mg total) by mouth daily.    Dispense:  90 capsule    Refill:  1  . DISCONTD: Vilazodone HCl (VIIBRYD STARTER PACK) 10 & 20 MG KIT    Sig: Take 1 tablet by mouth daily.    Dispense:  1 kit    Refill:  0  . escitalopram (LEXAPRO) 10 MG tablet    Sig: Take 1 tablet (10 mg total) by mouth daily.    Dispense:  90 tablet    Refill:  0  . irbesartan (AVAPRO) 150 MG tablet    Sig: Take 1 tablet (150 mg total) by mouth daily.    Dispense:  90 tablet    Refill:  1     Follow-up: Return in  about 3 months (around 05/13/2019).  Scarlette Calico, MD

## 2019-02-12 LAB — HIV ANTIBODY (ROUTINE TESTING W REFLEX): HIV 1&2 Ab, 4th Generation: NONREACTIVE

## 2019-03-08 ENCOUNTER — Encounter: Payer: Self-pay | Admitting: Internal Medicine

## 2019-03-09 ENCOUNTER — Other Ambulatory Visit: Payer: Self-pay | Admitting: Internal Medicine

## 2019-03-09 DIAGNOSIS — K2100 Gastro-esophageal reflux disease with esophagitis, without bleeding: Secondary | ICD-10-CM

## 2019-03-09 DIAGNOSIS — I10 Essential (primary) hypertension: Secondary | ICD-10-CM

## 2019-03-09 DIAGNOSIS — F419 Anxiety disorder, unspecified: Secondary | ICD-10-CM

## 2019-03-09 MED ORDER — IRBESARTAN 150 MG PO TABS: 150.0000 mg | ORAL_TABLET | Freq: Every day | ORAL | 1 refills | Status: DC

## 2019-03-09 MED ORDER — OMEPRAZOLE 40 MG PO CPDR: 40.0000 mg | DELAYED_RELEASE_CAPSULE | Freq: Every day | ORAL | 1 refills | Status: DC

## 2019-03-09 MED ORDER — ESCITALOPRAM OXALATE 20 MG PO TABS: 20.0000 mg | ORAL_TABLET | Freq: Every day | ORAL | 1 refills | Status: DC

## 2019-06-22 ENCOUNTER — Ambulatory Visit (INDEPENDENT_AMBULATORY_CARE_PROVIDER_SITE_OTHER): Payer: BC Managed Care – PPO | Admitting: Internal Medicine

## 2019-06-22 ENCOUNTER — Encounter: Payer: Self-pay | Admitting: Internal Medicine

## 2019-06-22 ENCOUNTER — Other Ambulatory Visit: Payer: Self-pay

## 2019-06-22 VITALS — BP 138/88 | HR 73 | Temp 98.5°F | Resp 16 | Ht 74.0 in | Wt 260.0 lb

## 2019-06-22 DIAGNOSIS — I10 Essential (primary) hypertension: Secondary | ICD-10-CM | POA: Diagnosis not present

## 2019-06-22 DIAGNOSIS — M5442 Lumbago with sciatica, left side: Secondary | ICD-10-CM

## 2019-06-22 DIAGNOSIS — F419 Anxiety disorder, unspecified: Secondary | ICD-10-CM

## 2019-06-22 DIAGNOSIS — M5126 Other intervertebral disc displacement, lumbar region: Secondary | ICD-10-CM

## 2019-06-22 DIAGNOSIS — K2100 Gastro-esophageal reflux disease with esophagitis, without bleeding: Secondary | ICD-10-CM

## 2019-06-22 MED ORDER — METHYLPREDNISOLONE 4 MG PO TBPK: ORAL_TABLET | ORAL | 0 refills | Status: AC

## 2019-06-22 MED ORDER — MELOXICAM 15 MG PO TABS: 15.0000 mg | ORAL_TABLET | Freq: Every day | ORAL | 0 refills | Status: DC

## 2019-06-22 MED ORDER — OMEPRAZOLE 40 MG PO CPDR: 40.0000 mg | DELAYED_RELEASE_CAPSULE | Freq: Every day | ORAL | 1 refills | Status: DC

## 2019-06-22 MED ORDER — ESCITALOPRAM OXALATE 20 MG PO TABS: 20.0000 mg | ORAL_TABLET | Freq: Every day | ORAL | 1 refills | Status: DC

## 2019-06-22 MED ORDER — IRBESARTAN 150 MG PO TABS: 150.0000 mg | ORAL_TABLET | Freq: Every day | ORAL | 1 refills | Status: DC

## 2019-06-22 NOTE — Progress Notes (Signed)
Subjective:  Patient ID: Ethan Cruz, male    DOB: 1996-04-05  Age: 24 y.o. MRN: [id number]  CC: Hypertension, Back Pain, Gastroesophageal Reflux, and Depression  This visit occurred during the SARS-CoV-2 public health emergency.  Safety protocols were in place, including screening questions prior to the visit, additional usage of staff PPE, and extensive cleaning of exam room while observing appropriate contact time as indicated for disinfecting solutions.   HPI Ethan Cruz presents for concerns about a 1 week history of nontraumatic low back pain.  He describes an intermittent pulling, stabbing, and achy sensation that radiates into his left thigh.  He denies numbness, weakness, or tingling.  He is not getting much symptom relief with Advil.  Outpatient Medications Prior to Visit  Medication Sig Dispense Refill  . escitalopram (LEXAPRO) 20 MG tablet Take 1 tablet (20 mg total) by mouth daily. 90 tablet 1  . irbesartan (AVAPRO) 150 MG tablet Take 1 tablet (150 mg total) by mouth daily. 90 tablet 1  . omeprazole (PRILOSEC) 40 MG capsule Take 1 capsule (40 mg total) by mouth daily. 90 capsule 1   No facility-administered medications prior to visit.    ROS Review of Systems  Constitutional: Negative.  Negative for chills, diaphoresis, fatigue and fever.  HENT: Negative.  Negative for sore throat, trouble swallowing and voice change.   Eyes: Negative for visual disturbance.  Respiratory: Negative for cough, chest tightness, shortness of breath and wheezing.   Cardiovascular: Negative for chest pain, palpitations and leg swelling.  Gastrointestinal: Negative for abdominal pain, diarrhea and nausea.  Endocrine: Negative.   Genitourinary: Negative.  Negative for difficulty urinating.  Musculoskeletal: Positive for back pain. Negative for myalgias.  Skin: Negative.  Negative for color change.  Neurological: Negative.  Negative for dizziness, weakness and numbness.  Hematological:  Negative.   Psychiatric/Behavioral: Negative.  Negative for dysphoric mood and sleep disturbance. The patient is not nervous/anxious.     Objective:  BP 138/88 (BP Location: Left Arm, Patient Position: Sitting, Cuff Size: Large)   Pulse 73   Temp 98.5 F (36.9 C) (Oral)   Resp 16   Ht 6\' 2"  (1.88 m)   Wt 260 lb (117.9 kg)   SpO2 97%   BMI 33.38 kg/m   BP Readings from Last 3 Encounters:  06/22/19 138/88  02/11/19 (!) 142/92  12/29/18 120/80    Wt Readings from Last 3 Encounters:  06/22/19 260 lb (117.9 kg)  02/11/19 250 lb (113.4 kg)  12/29/18 259 lb (117.5 kg)    Physical Exam Vitals reviewed.  Constitutional:      Appearance: Normal appearance.  HENT:     Nose: Nose normal.     Mouth/Throat:     Mouth: Mucous membranes are moist.  Eyes:     General: No scleral icterus.    Conjunctiva/sclera: Conjunctivae normal.  Cardiovascular:     Rate and Rhythm: Normal rate and regular rhythm.     Heart sounds: No murmur.  Pulmonary:     Effort: Pulmonary effort is normal.     Breath sounds: No stridor. No wheezing, rhonchi or rales.  Abdominal:     General: Abdomen is flat. Bowel sounds are normal. There is no distension.     Palpations: Abdomen is soft. There is no hepatomegaly or splenomegaly.     Tenderness: There is no abdominal tenderness.  Musculoskeletal:        General: Normal range of motion.     Cervical back: Neck supple.  Lumbar back: Normal. No swelling, edema, deformity, tenderness or bony tenderness. Negative right straight leg raise test and negative left straight leg raise test.     Right lower leg: No edema.     Left lower leg: No edema.  Lymphadenopathy:     Cervical: No cervical adenopathy.  Skin:    General: Skin is warm and dry.  Neurological:     General: No focal deficit present.     Mental Status: He is alert and oriented to person, place, and time. Mental status is at baseline.     Cranial Nerves: Cranial nerves are intact.      Sensory: Sensation is intact.     Motor: Motor function is intact. No weakness.     Coordination: Coordination is intact.     Deep Tendon Reflexes: Reflexes normal.     Reflex Scores:      Tricep reflexes are 0 on the right side and 0 on the left side.      Bicep reflexes are 0 on the right side and 0 on the left side.      Brachioradialis reflexes are 0 on the right side and 0 on the left side.      Patellar reflexes are 1+ on the right side and 1+ on the left side.      Achilles reflexes are 1+ on the right side and 1+ on the left side.    Lab Results  Component Value Date   WBC 7.6 02/11/2019   HGB 16.6 02/11/2019   HCT 49.5 02/11/2019   PLT 245.0 02/11/2019   GLUCOSE 96 02/11/2019   CHOL 138 02/11/2019   TRIG 132.0 02/11/2019   HDL 31.70 (L) 02/11/2019   LDLDIRECT 87.9 11/30/2010   LDLCALC 80 02/11/2019   ALT 39 02/11/2019   AST 18 02/11/2019   NA 142 02/11/2019   K 4.2 02/11/2019   CL 105 02/11/2019   CREATININE 0.99 02/11/2019   BUN 9 02/11/2019   CO2 28 02/11/2019   TSH 1.27 02/11/2019   HGBA1C 5.3 02/11/2019    DG Foot Complete Left  Result Date: 12/29/2018 CLINICAL DATA:  Crush injury 3 weeks ago. EXAM: LEFT FOOT - COMPLETE 3+ VIEW COMPARISON:  None. FINDINGS: Osseous alignment is normal. No fracture line or displaced fracture fragment seen. Soft tissues about the foot are unremarkable. IMPRESSION: Negative. Electronically Signed   By: Bary Richard M.D.   On: 12/29/2018 16:33    Assessment & Plan:   Domnick was seen today for hypertension, back pain, gastroesophageal reflux and depression.  Diagnoses and all orders for this visit:  Essential hypertension- His blood pressure is adequately well controlled.  Will continue the current dose of the ARB. -     irbesartan (AVAPRO) 150 MG tablet; Take 1 tablet (150 mg total) by mouth daily.  Acute left-sided low back pain with left-sided sciatica- He has radiating left lower back pain but is neurologically intact.   His symptoms and exam are consistent with a disc herniation.  Will treat with a 6-day course of methylprednisolone and will control the pain with meloxicam as needed. -     methylPREDNISolone (MEDROL DOSEPAK) 4 MG TBPK tablet; TAKE AS DIRECTED -     meloxicam (MOBIC) 15 MG tablet; Take 1 tablet (15 mg total) by mouth daily.  Lumbar disc herniation- As above -     methylPREDNISolone (MEDROL DOSEPAK) 4 MG TBPK tablet; TAKE AS DIRECTED -     meloxicam (MOBIC) 15 MG tablet; Take  1 tablet (15 mg total) by mouth daily.  GERD with esophagitis- His symptoms are well controlled.  No alarming features or complications noted.  Will continue current dose of the PPI. -     omeprazole (PRILOSEC) 40 MG capsule; Take 1 capsule (40 mg total) by mouth daily.  Anxiety- His symptoms have improved on the current dose of escitalopram.  Will continue. -     escitalopram (LEXAPRO) 20 MG tablet; Take 1 tablet (20 mg total) by mouth daily.   I am having Disney Ruggiero Broom. Bonk start on methylPREDNISolone and meloxicam. I am also having him maintain his omeprazole, irbesartan, and escitalopram.  Meds ordered this encounter  Medications  . methylPREDNISolone (MEDROL DOSEPAK) 4 MG TBPK tablet    Sig: TAKE AS DIRECTED    Dispense:  21 tablet    Refill:  0  . meloxicam (MOBIC) 15 MG tablet    Sig: Take 1 tablet (15 mg total) by mouth daily.    Dispense:  90 tablet    Refill:  0  . omeprazole (PRILOSEC) 40 MG capsule    Sig: Take 1 capsule (40 mg total) by mouth daily.    Dispense:  90 capsule    Refill:  1  . irbesartan (AVAPRO) 150 MG tablet    Sig: Take 1 tablet (150 mg total) by mouth daily.    Dispense:  90 tablet    Refill:  1  . escitalopram (LEXAPRO) 20 MG tablet    Sig: Take 1 tablet (20 mg total) by mouth daily.    Dispense:  90 tablet    Refill:  1     Follow-up: Return in about 6 weeks (around 08/03/2019).  Scarlette Calico, MD

## 2019-06-22 NOTE — Patient Instructions (Signed)
Acute Back Pain, Adult Acute back pain is sudden and usually short-lived. It is often caused by an injury to the muscles and tissues in the back. The injury may result from:  A muscle or ligament getting overstretched or torn (strained). Ligaments are tissues that connect bones to each other. Lifting something improperly can cause a back strain.  Wear and tear (degeneration) of the spinal disks. Spinal disks are circular tissue that provides cushioning between the bones of the spine (vertebrae).  Twisting motions, such as while playing sports or doing yard work.  A hit to the back.  Arthritis. You may have a physical exam, lab tests, and imaging tests to find the cause of your pain. Acute back pain usually goes away with rest and home care. Follow these instructions at home: Managing pain, stiffness, and swelling  Take over-the-counter and prescription medicines only as told by your health care provider.  Your health care provider may recommend applying ice during the first 24-48 hours after your pain starts. To do this: ? Put ice in a plastic bag. ? Place a towel between your skin and the bag. ? Leave the ice on for 20 minutes, 2-3 times a day.  If directed, apply heat to the affected area as often as told by your health care provider. Use the heat source that your health care provider recommends, such as a moist heat pack or a heating pad. ? Place a towel between your skin and the heat source. ? Leave the heat on for 20-30 minutes. ? Remove the heat if your skin turns bright red. This is especially important if you are unable to feel pain, heat, or cold. You have a greater risk of getting burned. Activity   Do not stay in bed. Staying in bed for more than 1-2 days can delay your recovery.  Sit up and stand up straight. Avoid leaning forward when you sit, or hunching over when you stand. ? If you work at a desk, sit close to it so you do not need to lean over. Keep your chin tucked  in. Keep your neck drawn back, and keep your elbows bent at a right angle. Your arms should look like the letter "L." ? Sit high and close to the steering wheel when you drive. Add lower back (lumbar) support to your car seat, if needed.  Take short walks on even surfaces as soon as you are able. Try to increase the length of time you walk each day.  Do not sit, drive, or stand in one place for more than 30 minutes at a time. Sitting or standing for long periods of time can put stress on your back.  Do not drive or use heavy machinery while taking prescription pain medicine.  Use proper lifting techniques. When you bend and lift, use positions that put less stress on your back: ? Bend your knees. ? Keep the load close to your body. ? Avoid twisting.  Exercise regularly as told by your health care provider. Exercising helps your back heal faster and helps prevent back injuries by keeping muscles strong and flexible.  Work with a physical therapist to make a safe exercise program, as recommended by your health care provider. Do any exercises as told by your physical therapist. Lifestyle  Maintain a healthy weight. Extra weight puts stress on your back and makes it difficult to have good posture.  Avoid activities or situations that make you feel anxious or stressed. Stress and anxiety increase muscle   tension and can make back pain worse. Learn ways to manage anxiety and stress, such as through exercise. General instructions  Sleep on a firm mattress in a comfortable position. Try lying on your side with your knees slightly bent. If you lie on your back, put a pillow under your knees.  Follow your treatment plan as told by your health care provider. This may include: ? Cognitive or behavioral therapy. ? Acupuncture or massage therapy. ? Meditation or yoga. Contact a health care provider if:  You have pain that is not relieved with rest or medicine.  You have increasing pain going down  into your legs or buttocks.  Your pain does not improve after 2 weeks.  You have pain at night.  You lose weight without trying.  You have a fever or chills. Get help right away if:  You develop new bowel or bladder control problems.  You have unusual weakness or numbness in your arms or legs.  You develop nausea or vomiting.  You develop abdominal pain.  You feel faint. Summary  Acute back pain is sudden and usually short-lived.  Use proper lifting techniques. When you bend and lift, use positions that put less stress on your back.  Take over-the-counter and prescription medicines and apply heat or ice as directed by your health care provider. This information is not intended to replace advice given to you by your health care provider. Make sure you discuss any questions you have with your health care provider. Document Revised: 09/15/2018 Document Reviewed: 01/08/2017 Elsevier Patient Education  2020 [employer]  

## 2019-09-09 ENCOUNTER — Other Ambulatory Visit: Payer: Self-pay | Admitting: Internal Medicine

## 2019-09-09 DIAGNOSIS — F419 Anxiety disorder, unspecified: Secondary | ICD-10-CM

## 2019-09-09 DIAGNOSIS — K2100 Gastro-esophageal reflux disease with esophagitis, without bleeding: Secondary | ICD-10-CM

## 2019-09-09 DIAGNOSIS — I10 Essential (primary) hypertension: Secondary | ICD-10-CM

## 2020-01-13 ENCOUNTER — Other Ambulatory Visit: Payer: Self-pay | Admitting: Internal Medicine

## 2020-01-13 DIAGNOSIS — I10 Essential (primary) hypertension: Secondary | ICD-10-CM

## 2020-02-02 ENCOUNTER — Encounter: Payer: Self-pay | Admitting: Family Medicine

## 2020-02-02 ENCOUNTER — Ambulatory Visit: Payer: Self-pay

## 2020-02-02 ENCOUNTER — Ambulatory Visit (INDEPENDENT_AMBULATORY_CARE_PROVIDER_SITE_OTHER): Payer: BC Managed Care – PPO | Admitting: Family Medicine

## 2020-02-02 ENCOUNTER — Other Ambulatory Visit: Payer: Self-pay

## 2020-02-02 VITALS — BP 102/78 | HR 73 | Ht 74.0 in | Wt 277.0 lb

## 2020-02-02 DIAGNOSIS — M25571 Pain in right ankle and joints of right foot: Secondary | ICD-10-CM | POA: Diagnosis not present

## 2020-02-02 DIAGNOSIS — M67471 Ganglion, right ankle and foot: Secondary | ICD-10-CM

## 2020-02-02 NOTE — Patient Instructions (Addendum)
Thank you for coming in today. Call or go to the ER if you develop a large red swollen joint with extreme pain or oozing puss.  Use voltaren gel on the rest of the foot.  Offload the pressure on your planar foot.  Consider cam walker boot if limping a lot.  If not improving let me know. Likely will do PT or recheck.    Ganglion Cyst  A ganglion cyst is a non-cancerous, fluid-filled lump that occurs near a joint or tendon. The cyst grows out of a joint or the lining of a tendon. Ganglion cysts most often develop in the hand or wrist, but they can also develop in the shoulder, elbow, hip, knee, ankle, or foot. Ganglion cysts are ball-shaped or egg-shaped. Their size can range from the size of a pea to larger than a grape. Increased activity may cause the cyst to get bigger because more fluid starts to build up. What are the causes? The exact cause of this condition is not known, but it may be related to:  Inflammation or irritation around the joint.  An injury.  Repetitive movements or overuse.  Arthritis. What increases the risk? You are more likely to develop this condition if:  You are a woman.  You are 65-33 years old. What are the signs or symptoms? The main symptom of this condition is a lump. It most often appears on the hand or wrist. In many cases, there are no other symptoms, but a cyst can sometimes cause:  Tingling.  Pain.  Numbness.  Muscle weakness.  Weak grip.  Less range of motion in a joint. How is this diagnosed? Ganglion cysts are usually diagnosed based on a physical exam. Your health care provider will feel the lump and may shine a light next to it. If it is a ganglion cyst, the light will likely shine through it. Your health care provider may order an X-ray, ultrasound, or MRI to rule out other conditions. How is this treated? Ganglion cysts often go away on their own without treatment. If you have pain or other symptoms, treatment may be needed.  Treatment is also needed if the ganglion cyst limits your movement or if it gets infected. Treatment may include:  Wearing a brace or splint on your wrist or finger.  Taking anti-inflammatory medicine.  Having fluid drained from the lump with a needle (aspiration).  Getting a steroid injected into the joint.  Having surgery to remove the ganglion cyst.  Placing a pad on your shoe or wearing shoes that will not rub against the cyst if it is on your foot. Follow these instructions at home:  Do not press on the ganglion cyst, poke it with a needle, or hit it.  Take over-the-counter and prescription medicines only as told by your health care provider.  If you have a brace or splint: ? Wear it as told by your health care provider. ? Remove it as told by your health care provider. Ask if you need to remove it when you take a shower or a bath.  Watch your ganglion cyst for any changes.  Keep all follow-up visits as told by your health care provider. This is important. Contact a health care provider if:  Your ganglion cyst becomes larger or more painful.  You have pus coming from the lump.  You have weakness or numbness in the affected area.  You have a fever or chills. Get help right away if:  You have a fever and  have any of these in the cyst area: ? Increased redness. ? Red streaks. ? Swelling. Summary  A ganglion cyst is a non-cancerous, fluid-filled lump that occurs near a joint or tendon.  Ganglion cysts most often develop in the hand or wrist, but they can also develop in the shoulder, elbow, hip, knee, ankle, or foot.  Ganglion cysts often go away on their own without treatment. This information is not intended to replace advice given to you by your health care provider. Make sure you discuss any questions you have with your health care provider. Document Revised: 05/09/2017 Document Reviewed: 01/24/2017 Elsevier Patient Education  2020 [employer].

## 2020-02-02 NOTE — Progress Notes (Signed)
Subjective:    CC: R ankle pain  I, Ethan Cruz, LAT, ATC, am serving as scribe for Dr. [name].  HPI: Pt is a 24 y/o male presenting w/ c/o R ankle pain x 2 months w/ no known MOI.  He locates his pain to his R lateral ankle and also along his R plantar foot. He notes a nodule on the plantar medial midfoot.   Radiating pain: yes along the plantar aspect of the R foot and up into his post lower leg R ankle swelling: yes distal to his R lateral malleolus R ankle mechanical symptoms: yes Aggravating factors: standing; walking Treatments tried: Advil, Aleve, Biofreeze   Pertinent review of Systems: No fevers or chills  Relevant historical information: ADHD, hypertension   Objective:    Vitals:   02/02/20 0803  BP: 102/78  Pulse: 73  SpO2: 96%   General: Well Developed, well nourished, and in no acute distress.   MSK: Right foot and ankle largely normal-appearing slight swelling in ankle. Normal foot and ankle motion. Not particular tender to palpation at ankle. Stable ligamentous exam ankle. Foot small plantar nodule superficial medial midfoot tender to palpation.  Lab and Radiology Results Diagnostic Limited MSK Ultrasound of: Right foot and ankle Lateral ankle structures normal-appearing with no significant tenosynovitis of peroneal tendons. No joint effusion. Plantar nodule there is a small 3 mm simple cystic structure superficial to flexor tendon consistent with ganglion cyst. Impression: Ganglion cyst right plantar foot  Procedure: Real-time Ultrasound Guided Injection of right foot plantar ganglion cyst Device: Philips Affiniti 50G Images permanently stored and available for review in PACS Verbal informed consent obtained.  Discussed risks and benefits of procedure. Warned about infection bleeding damage to structures skin hypopigmentation and fat atrophy among others. Patient expresses understanding and agreement Time-out conducted.   Noted no  overlying erythema, induration, or other signs of local infection.   Skin prepped in a sterile fashion.   Local anesthesia: Topical Ethyl chloride.   With sterile technique and under real time ultrasound guidance:  40 mg of Kenalog and 1 mL of Marcaine injected easily.   Completed without difficulty   Pain immediately resolved suggesting accurate placement of the medication.   Advised to call if fevers/chills, erythema, induration, drainage, or persistent bleeding.   Images permanently stored and available for review in the ultrasound unit.  Impression: Technically successful ultrasound guided injection.       Impression and Recommendations:    Assessment and Plan: 24 y.o. male with right plantar foot pain consistent with ganglion cyst in an awkward position.  I believe the pain he is experiencing at the medial midfoot is causing him to effectively limp and put more pressure on the lateral foot and ankle causing pain.  Plan for injection of ganglion cyst today Voltaren gel and offloading pressure at the plantar medial midfoot.  Recommend use of cam walker boot as needed.  If not improving patient will notify me we will proceed with physical therapy trial.  Recheck back as needed.  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  I agree and do not think that he can participate in taekwondo at this time.   Orders Placed This Encounter  Procedures  . [place] LIMITED JOINT SPACE STRUCTURES LOW RIGHT(NO LINKED CHARGES)    Order Specific Question:   Reason for Exam (SYMPTOM  OR DIAGNOSIS REQUIRED)    Answer:  R ankle pain    Order Specific Question:   Preferred imaging location?    Answer:   [facility]   No orders of the defined types were placed in this encounter.   Discussed warning signs or symptoms. Please see discharge instructions. Patient expresses understanding.   The above  documentation has been reviewed and is accurate and complete [name], M.D.

## 2020-03-16 ENCOUNTER — Other Ambulatory Visit: Payer: Self-pay | Admitting: Internal Medicine

## 2020-03-16 DIAGNOSIS — K2100 Gastro-esophageal reflux disease with esophagitis, without bleeding: Secondary | ICD-10-CM

## 2020-03-16 DIAGNOSIS — F419 Anxiety disorder, unspecified: Secondary | ICD-10-CM

## 2020-08-21 ENCOUNTER — Encounter: Payer: Self-pay | Admitting: Internal Medicine

## 2020-08-21 ENCOUNTER — Telehealth (INDEPENDENT_AMBULATORY_CARE_PROVIDER_SITE_OTHER): Payer: BC Managed Care – PPO | Admitting: Internal Medicine

## 2020-08-21 DIAGNOSIS — J0100 Acute maxillary sinusitis, unspecified: Secondary | ICD-10-CM

## 2020-08-21 MED ORDER — AMOXICILLIN-POT CLAVULANATE 875-125 MG PO TABS: 1.0000 | ORAL_TABLET | Freq: Two times a day (BID) | ORAL | 0 refills | Status: DC

## 2020-08-21 NOTE — Progress Notes (Signed)
Virtual Visit via Video Note  I connected with Ethan Cruz on 08/21/20 at  3:00 PM EDT by a video enabled telemedicine application and verified that I am speaking with the correct person using two identifiers.   I discussed the limitations of evaluation and management by telemedicine and the availability of in person appointments. The patient expressed understanding and agreed to proceed.  Present for the visit:  Myself, Dr Cheryll Cockayne, Alfonso Ramus.  The patient is currently at home and I am in the office.    No referring provider.    History of Present Illness: This is an acute visit for headache and congestion.   It started 2-3 weeks ago, but last night his symptoms were terrible.  Dad was sick and he was in contact with him-he believes he had bronchitis or PNA.  His symptoms have also been worse since the pollen has come out.   He states fatigue, nasal congestion with yellow nasal discharge, ear pain, sinus pain, sneezing, postnasal drip and frontal headaches.  He has taken Zyrtec, Mucinex and BC powder with minimal relief of his symptoms.  He does have a history of sinus infections.   VS 141/86    HR 80     O2   95%,  Temp  98.7   Review of Systems  Constitutional: Positive for malaise/fatigue. Negative for chills and fever.  HENT: Positive for congestion (yellow nasal d/c), ear pain and sinus pain. Negative for sore throat.        Sneezing, PND  Respiratory: Negative for cough, shortness of breath and wheezing.   Cardiovascular: Negative for chest pain.  Gastrointestinal: Negative for abdominal pain, diarrhea and nausea.  Musculoskeletal: Negative for myalgias.  Neurological: Positive for headaches (frontal). Negative for dizziness.       Social History   Socioeconomic History  . Marital status: Single    Spouse name: Not on file  . Number of children: Not on file  . Years of education: Not on file  . Highest education level: Not on file  Occupational History  . Not  on file  Tobacco Use  . Smoking status: Never Smoker  . Smokeless tobacco: Never Used  Substance and Sexual Activity  . Alcohol use: No  . Drug use: No  . Sexual activity: Not Currently    Birth control/protection: Abstinence  Other Topics Concern  . Not on file  Social History Narrative  . Not on file   Social Determinants of Health   Financial Resource Strain: Not on file  Food Insecurity: Not on file  Transportation Needs: Not on file  Physical Activity: Not on file  Stress: Not on file  Social Connections: Not on file     Observations/Objective: Appears well in NAD Breathing normally Skin appears warm and dry  Assessment and Plan:  Sinus infection: Acute Likely bacterial  Start Augmentin 875-125 mg BID x 10 day otc cold medications-advised to be careful with BC powder because of possible elevation of blood pressure and injury to his stomach Rest, fluid Call if no improvement    Follow Up Instructions:    I discussed the assessment and treatment plan with the patient. The patient was provided an opportunity to ask questions and all were answered. The patient agreed with the plan and demonstrated an understanding of the instructions.   The patient was advised to call back or seek an in-person evaluation if the symptoms worsen or if the condition fails to improve as anticipated.  Binnie Rail, MD

## 2020-09-20 ENCOUNTER — Other Ambulatory Visit: Payer: Self-pay | Admitting: Internal Medicine

## 2020-09-20 DIAGNOSIS — I10 Essential (primary) hypertension: Secondary | ICD-10-CM

## 2020-09-20 DIAGNOSIS — K2100 Gastro-esophageal reflux disease with esophagitis, without bleeding: Secondary | ICD-10-CM

## 2020-09-20 DIAGNOSIS — F419 Anxiety disorder, unspecified: Secondary | ICD-10-CM

## 2021-01-04 ENCOUNTER — Ambulatory Visit: Payer: BC Managed Care – PPO | Admitting: Internal Medicine

## 2021-01-08 ENCOUNTER — Encounter: Payer: Self-pay | Admitting: Internal Medicine

## 2021-01-08 ENCOUNTER — Other Ambulatory Visit: Payer: Self-pay

## 2021-01-08 ENCOUNTER — Ambulatory Visit (INDEPENDENT_AMBULATORY_CARE_PROVIDER_SITE_OTHER): Payer: BC Managed Care – PPO | Admitting: Internal Medicine

## 2021-01-08 VITALS — BP 130/82 | HR 70 | Temp 98.3°F | Ht 74.0 in | Wt 282.0 lb

## 2021-01-08 DIAGNOSIS — F419 Anxiety disorder, unspecified: Secondary | ICD-10-CM

## 2021-01-08 DIAGNOSIS — R7303 Prediabetes: Secondary | ICD-10-CM | POA: Diagnosis not present

## 2021-01-08 DIAGNOSIS — I10 Essential (primary) hypertension: Secondary | ICD-10-CM

## 2021-01-08 DIAGNOSIS — N5089 Other specified disorders of the male genital organs: Secondary | ICD-10-CM | POA: Diagnosis not present

## 2021-01-08 NOTE — Assessment & Plan Note (Signed)
situationally worse today, pt reassured, cont lexapro, . to f/u any worsening symptoms or concerns

## 2021-01-08 NOTE — Assessment & Plan Note (Signed)
Lab Results  Component Value Date   HGBA1C 5.3 02/11/2019   Stable, pt to continue current medical treatment  - diet

## 2021-01-08 NOTE — Assessment & Plan Note (Signed)
Likely benign it seems by exam but cant r/o maligancy- will hold afp and beta hcg for now, but for scrotal u/s, consider urology or oncology referral

## 2021-01-08 NOTE — Progress Notes (Signed)
Patient ID: Ethan Cruz, male   DOB: 11/26/1995, 25 y.o.   MRN: [id number]        Chief Complaint: follow up left scrotal mass       HPI:  Ethan Cruz is a 25 y.o. male here with c/o 1 wk onset noted dual mass to left scrotum, both relatively small at the upper and lower poles left testicle, associated with some tenderness of the masses, but no other pain and Denies urinary symptoms such as dysuria, frequency, urgency, flank pain, hematuria or n/v, fever, chillls.  Pt denies chest pain, increased sob or doe, wheezing, orthopnea, PND, increased LE swelling, palpitations, dizziness or syncope.  No prior hx of same.  Pt denies polydipsia, polyuria.   Denies worsening depressive symptoms, suicidal ideation, or panic       Wt Readings from Last 3 Encounters:  01/08/21 282 lb (127.9 kg)  02/02/20 277 lb (125.6 kg)  06/22/19 260 lb (117.9 kg)   BP Readings from Last 3 Encounters:  01/08/21 130/82  02/02/20 102/78  06/22/19 138/88         Past Medical History:  Diagnosis Date   ADHD (attention deficit hyperactivity disorder)    Allergic rhinitis, cause unspecified 09/19/2011   Asthma 09/19/2011   Auditory processing disorder    Depression    Dysgraphia    Glucose intolerance (pre-diabetes) 2011   Morbid obesity (HCC)    Past Surgical History:  Procedure Laterality Date   NO PAST SURGERIES      reports that he has never smoked. He has never used smokeless tobacco. He reports that he does not drink alcohol and does not use drugs. family history includes Arthritis in his mother and another family member; Clotting disorder (age of onset: 63) in his mother; Diabetes in his mother and another family member; Heart disease in an other family member; Hypertension in an other family member; Ovarian cancer in an other family member; Stroke in an other family member. No Known Allergies Current Outpatient Medications on File Prior to Visit  Medication Sig Dispense Refill   escitalopram (LEXAPRO) 20  MG tablet TAKE 1 TABLET(20 MG) BY MOUTH DAILY 90 tablet 1   irbesartan (AVAPRO) 150 MG tablet TAKE 1 TABLET(150 MG) BY MOUTH DAILY 90 tablet 1   omeprazole (PRILOSEC) 40 MG capsule TAKE 1 CAPSULE(40 MG) BY MOUTH DAILY 90 capsule 1   amoxicillin-clavulanate (AUGMENTIN) 875-125 MG tablet Take 1 tablet by mouth 2 (two) times daily. (Patient not taking: Reported on 01/08/2021) 20 tablet 0   No current facility-administered medications on file prior to visit.        ROS:  All others reviewed and negative.  Objective        PE:  BP 130/82 (BP Location: Left Arm, Patient Position: Sitting, Cuff Size: Large)   Pulse 70   Temp 98.3 F (36.8 C) (Oral)   Ht 6\' 2"  (1.88 m)   Wt 282 lb (127.9 kg)   SpO2 96%   BMI 36.21 kg/m                 Constitutional: Pt appears in NAD               HENT: Head: NCAT.                Right Ear: External ear normal.                 Left Ear: External ear normal.  Eyes: . Pupils are equal, round, and reactive to light. Conjunctivae and EOM are normal               Nose: without d/c or deformity               Neck: Neck supple. Gross normal ROM               Cardiovascular: Normal rate and regular rhythm.                 Pulmonary/Chest: Effort normal and breath sounds without rales or wheezing.                Abd:  Soft, NT, ND, + BS, no organomegaly               Neurological: Pt is alert. At baseline orientation, motor grossly intact               Skin: Skin is warm. No rashes, no other new lesions, LE edema -none;  gu with normal male except for < 1/2 cm mild tender scrotal mass at upper and lower poles left testicle o/w no significant swelling, tender or other mass               Psychiatric: Pt behavior is normal without agitation , mild nervous  Micro: none  Cardiac tracings I have personally interpreted today:  none  Pertinent Radiological findings (summarize): none   Lab Results  Component Value Date   WBC 7.6 02/11/2019   HGB 16.6  02/11/2019   HCT 49.5 02/11/2019   PLT 245.0 02/11/2019   GLUCOSE 96 02/11/2019   CHOL 138 02/11/2019   TRIG 132.0 02/11/2019   HDL 31.70 (L) 02/11/2019   LDLDIRECT 87.9 11/30/2010   LDLCALC 80 02/11/2019   ALT 39 02/11/2019   AST 18 02/11/2019   NA 142 02/11/2019   K 4.2 02/11/2019   CL 105 02/11/2019   CREATININE 0.99 02/11/2019   BUN 9 02/11/2019   CO2 28 02/11/2019   TSH 1.27 02/11/2019   HGBA1C 5.3 02/11/2019   Assessment/Plan:  Ethan Cruz is a 25 y.o. White or Caucasian [1] male with  has a past medical history of ADHD (attention deficit hyperactivity disorder), Allergic rhinitis, cause unspecified (09/19/2011), Asthma (09/19/2011), Auditory processing disorder, Depression, Dysgraphia, Glucose intolerance (pre-diabetes) (2011), and Morbid obesity (HCC).  Scrotal mass Likely benign it seems by exam but cant r/o maligancy- will hold afp and beta hcg for now, but for scrotal u/s, consider urology or oncology referral  Prediabetes Lab Results  Component Value Date   HGBA1C 5.3 02/11/2019   Stable, pt to continue current medical treatment  - diet   Essential hypertension BP Readings from Last 3 Encounters:  01/08/21 130/82  02/02/20 102/78  06/22/19 138/88   Stable, pt to continue medical treatment avapro   Anxiety situationally worse today, pt reassured, cont lexapro, . to f/u any worsening symptoms or concerns  Followup: Return if symptoms worsen or fail to improve.  Oliver Barre, MD 01/08/2021 1:48 PM [facility]

## 2021-01-08 NOTE — Patient Instructions (Signed)
Please continue all other medications as before, and refills have been done if requested.  Please have the pharmacy call with any other refills you may need.  Please continue your efforts at being more active, low cholesterol diet, and weight control.  Please keep your appointments with your specialists as you may have planned  You will be contacted regarding the referral for: scrotum ultrasound

## 2021-01-08 NOTE — Assessment & Plan Note (Signed)
BP Readings from Last 3 Encounters:  01/08/21 130/82  02/02/20 102/78  06/22/19 138/88   Stable, pt to continue medical treatment avapro

## 2021-01-17 ENCOUNTER — Ambulatory Visit
Admission: RE | Admit: 2021-01-17 | Discharge: 2021-01-17 | Disposition: A | Discharge status: Home / Self Care | Payer: BC Managed Care – PPO | Source: Ambulatory Visit | Attending: Internal Medicine | Admitting: Internal Medicine

## 2021-01-17 ENCOUNTER — Other Ambulatory Visit: Payer: Self-pay

## 2021-01-17 DIAGNOSIS — N5089 Other specified disorders of the male genital organs: Secondary | ICD-10-CM

## 2021-01-19 ENCOUNTER — Encounter: Payer: Self-pay | Admitting: Internal Medicine

## 2021-01-21 ENCOUNTER — Other Ambulatory Visit: Payer: Self-pay | Admitting: Internal Medicine

## 2021-01-21 DIAGNOSIS — I10 Essential (primary) hypertension: Secondary | ICD-10-CM

## 2021-02-11 ENCOUNTER — Other Ambulatory Visit: Payer: Self-pay | Admitting: Internal Medicine

## 2021-02-11 DIAGNOSIS — I10 Essential (primary) hypertension: Secondary | ICD-10-CM

## 2021-02-22 ENCOUNTER — Other Ambulatory Visit: Payer: Self-pay

## 2021-02-22 ENCOUNTER — Ambulatory Visit (INDEPENDENT_AMBULATORY_CARE_PROVIDER_SITE_OTHER): Payer: BC Managed Care – PPO | Admitting: Internal Medicine

## 2021-02-22 VITALS — BP 152/86 | HR 70 | Ht 74.0 in | Wt 288.0 lb

## 2021-02-22 DIAGNOSIS — K219 Gastro-esophageal reflux disease without esophagitis: Secondary | ICD-10-CM | POA: Diagnosis not present

## 2021-02-22 DIAGNOSIS — I10 Essential (primary) hypertension: Secondary | ICD-10-CM | POA: Diagnosis not present

## 2021-02-22 DIAGNOSIS — F419 Anxiety disorder, unspecified: Secondary | ICD-10-CM

## 2021-02-22 DIAGNOSIS — R7303 Prediabetes: Secondary | ICD-10-CM | POA: Diagnosis not present

## 2021-02-22 DIAGNOSIS — E559 Vitamin D deficiency, unspecified: Secondary | ICD-10-CM

## 2021-02-22 DIAGNOSIS — K2100 Gastro-esophageal reflux disease with esophagitis, without bleeding: Secondary | ICD-10-CM

## 2021-02-22 MED ORDER — IRBESARTAN 150 MG PO TABS: ORAL_TABLET | ORAL | 2 refills | Status: DC

## 2021-02-22 MED ORDER — BUSPIRONE HCL 15 MG PO TABS: 15.0000 mg | ORAL_TABLET | Freq: Two times a day (BID) | ORAL | 2 refills | Status: DC | PRN

## 2021-02-22 MED ORDER — OMEPRAZOLE 40 MG PO CPDR: DELAYED_RELEASE_CAPSULE | ORAL | 2 refills | Status: DC

## 2021-02-22 MED ORDER — ESCITALOPRAM OXALATE 20 MG PO TABS: ORAL_TABLET | ORAL | 2 refills | Status: DC

## 2021-02-22 NOTE — Progress Notes (Signed)
Patient ID: ISHMEAL RORIE, male   DOB: [birth year], 25 y.o.   MRN: [id number]        Chief Complaint: follow up elevated BP, anxiety       HPI:  Ethan Cruz is a 25 y.o. male here with uncontrolled elevated BP at home after out of BP med for 5 days; has tolerated well in past, but has not been able to make f/u in time before med ran out and asked to make ROV; Pt denies chest pain, increased sob or doe, wheezing, orthopnea, PND, increased LE swelling, palpitations, dizziness or syncope.  Pt denies polydipsia, polyuria, or new focal neuro s/s.  Denies worsening depressive symptoms, suicidal ideation, or panic; has ongoing anxiety, which has been mild to mod increased recently due to increased social stressors, asking for tx.  Also, Denies worsening reflux, abd pain, dysphagia, n/v, bowel change or blood, but asks for PPI refill as well.   Wt Readings from Last 3 Encounters:  02/22/21 288 lb (130.6 kg)  01/08/21 282 lb (127.9 kg)  02/02/20 277 lb (125.6 kg)   BP Readings from Last 3 Encounters:  02/22/21 (!) 152/86  01/08/21 130/82  02/02/20 102/78         Past Medical History:  Diagnosis Date   ADHD (attention deficit hyperactivity disorder)    Allergic rhinitis, cause unspecified 09/19/2011   Asthma 09/19/2011   Auditory processing disorder    Depression    Dysgraphia    Glucose intolerance (pre-diabetes) 2011   Morbid obesity (HCC)    Past Surgical History:  Procedure Laterality Date   NO PAST SURGERIES      reports that he has never smoked. He has never used smokeless tobacco. He reports that he does not drink alcohol and does not use drugs. family history includes Arthritis in his mother and another family member; Clotting disorder (age of onset: 51) in his mother; Diabetes in his mother and another family member; Heart disease in an other family member; Hypertension in an other family member; Ovarian cancer in an other family member; Stroke in an other family member. No Known  Allergies No current outpatient medications on file prior to visit.   No current facility-administered medications on file prior to visit.        ROS:  All others reviewed and negative.  Objective        PE:  BP (!) 152/86   Pulse 70   Ht 6\' 2"  (1.88 m)   Wt 288 lb (130.6 kg)   SpO2 95%   BMI 36.98 kg/m                 Constitutional: Pt appears in NAD               HENT: Head: NCAT.                Right Ear: External ear normal.                 Left Ear: External ear normal.                Eyes: . Pupils are equal, round, and reactive to light. Conjunctivae and EOM are normal               Nose: without d/c or deformity               Neck: Neck supple. Gross normal ROM  Cardiovascular: Normal rate and regular rhythm.                 Pulmonary/Chest: Effort normal and breath sounds without rales or wheezing.                Abd:  Soft, NT, ND, + BS, no organomegaly               Neurological: Pt is alert. At baseline orientation, motor grossly intact               Skin: Skin is warm. No rashes, no other new lesions, LE edema - none               Psychiatric: Pt behavior is normal without agitation , mild nervous  Micro: none  Cardiac tracings I have personally interpreted today:  none  Pertinent Radiological findings (summarize): none   Lab Results  Component Value Date   WBC 7.6 02/11/2019   HGB 16.6 02/11/2019   HCT 49.5 02/11/2019   PLT 245.0 02/11/2019   GLUCOSE 96 02/11/2019   CHOL 138 02/11/2019   TRIG 132.0 02/11/2019   HDL 31.70 (L) 02/11/2019   LDLDIRECT 87.9 11/30/2010   LDLCALC 80 02/11/2019   ALT 39 02/11/2019   AST 18 02/11/2019   NA 142 02/11/2019   K 4.2 02/11/2019   CL 105 02/11/2019   CREATININE 0.99 02/11/2019   BUN 9 02/11/2019   CO2 28 02/11/2019   TSH 1.27 02/11/2019   HGBA1C 5.3 02/11/2019   Assessment/Plan:  Ethan Cruz is a 25 y.o. White or Caucasian [1] male with  has a past medical history of ADHD (attention deficit  hyperactivity disorder), Allergic rhinitis, cause unspecified (09/19/2011), Asthma (09/19/2011), Auditory processing disorder, Depression, Dysgraphia, Glucose intolerance (pre-diabetes) (2011), and Morbid obesity (HCC).  Prediabetes Lab Results  Component Value Date   HGBA1C 5.3 02/11/2019   Stable, pt to continue current medical treatment  - diet   GERD with esophagitis Stable, ok to continue reflux tx including PPI and diet  Essential hypertension BP Readings from Last 3 Encounters:  02/22/21 (!) 152/86  01/08/21 130/82  02/02/20 102/78   Uncontrolled, pt to restart medical treatment with avapro  Anxiety Uncontrolled, for add buspar prn, continue lexapro, declines referral counseling or psychiatry for now  Followup: Return in about 6 months (around 08/22/2021) for to Dr Yetta Barre.  Oliver Barre, MD 02/26/2021 3:13 PM [facility]  Primary Care - [facility]

## 2021-02-22 NOTE — Patient Instructions (Signed)
Please take all new medication as prescribed - the buspar for nerves as needed  Please continue all other medications as before, and refills have been done if requested - the other 3 regular medications  Please have the pharmacy call with any other refills you may need.  Please continue your efforts at being more active, low cholesterol diet, and weight control.  Please keep your appointments with your specialists as you may have planned  Please make an Appointment to return in 6 months, or sooner if needed, with labs done about 3-5 days ahead at the [facility]

## 2021-02-26 ENCOUNTER — Encounter: Payer: Self-pay | Admitting: Internal Medicine

## 2021-02-26 DIAGNOSIS — K219 Gastro-esophageal reflux disease without esophagitis: Secondary | ICD-10-CM | POA: Insufficient documentation

## 2021-02-26 NOTE — Assessment & Plan Note (Signed)
Uncontrolled, for add buspar prn, continue lexapro, declines referral counseling or psychiatry for now

## 2021-02-26 NOTE — Assessment & Plan Note (Signed)
Lab Results  Component Value Date   HGBA1C 5.3 02/11/2019   Stable, pt to continue current medical treatment  - diet  

## 2021-02-26 NOTE — Assessment & Plan Note (Signed)
BP Readings from Last 3 Encounters:  02/22/21 (!) 152/86  01/08/21 130/82  02/02/20 102/78   Uncontrolled, pt to restart medical treatment with avapro

## 2021-02-26 NOTE — Assessment & Plan Note (Signed)
Stable, ok to continue reflux tx including PPI and diet

## 2021-03-16 ENCOUNTER — Encounter: Payer: Self-pay | Admitting: Nurse Practitioner

## 2021-03-16 ENCOUNTER — Telehealth: Payer: Self-pay | Admitting: Internal Medicine

## 2021-03-16 ENCOUNTER — Telehealth (INDEPENDENT_AMBULATORY_CARE_PROVIDER_SITE_OTHER): Payer: BC Managed Care – PPO | Admitting: Nurse Practitioner

## 2021-03-16 VITALS — Temp 98.2°F

## 2021-03-16 DIAGNOSIS — K529 Noninfective gastroenteritis and colitis, unspecified: Secondary | ICD-10-CM | POA: Diagnosis not present

## 2021-03-16 MED ORDER — ONDANSETRON HCL 4 MG PO TABS: 4.0000 mg | ORAL_TABLET | Freq: Two times a day (BID) | ORAL | 0 refills | Status: DC | PRN

## 2021-03-16 NOTE — Telephone Encounter (Signed)
Patient says he has not been feeling well since yesterday.. says he has been feeling nauseous & has threw up a few times  Asked patient if covid test had been done he said no but he would take one  Wants to know if provider is willing to write a work note for him since he was out of work yesterday & today due to not feeling well  Please call [phone]

## 2021-03-16 NOTE — Progress Notes (Signed)
Due to national recommendations of social distancing related to the COVID19 pandemic, an audio/visual tele-health visit was felt to be the most appropriate encounter type for this patient today. I connected with  Ethan Cruz on 03/16/21 utilizing audio/visual technology and verified that I am speaking with the correct person using two identifiers. The patient was located at their car, and I was located at the office of [facility] at [facility] during the encounter. I discussed the limitations of evaluation and management by telemedicine. The patient expressed understanding and agreed to proceed.     Subjective:  Patient ID: Ethan Cruz, male    DOB: [birth year]  Age: 25 y.o. MRN: [id number]  CC:  Chief Complaint  Patient presents with   Nausea      HPI  This patient arrives today for a video virtual visit.  Symptoms started 2 days ago.  He is having nausea, vomiting, and loose stools.  He tells me someone at his job also is having similar symptoms.  Yesterday the vomiting stopped but he still continues to have intermittent nausea.  He also reports some lightheadedness and headaches.  He tells me lightheadedness comes and goes and last for about 5 to 10 minutes and then he will experience a headache and then he will experience nausea, but the symptoms will eventually subside spontaneously.  He is also having abdominal cramping when he has loose stools but tells me the number of loose stools are slowing down.  Prior to his symptoms starting he had a sausage egg and cheese [employer] breakfast sandwich with a hash brown and has been trying to drink more water and reduce his [facility] intake.  Later that evening after that meal symptoms started and that is when he vomited 7 times.  He did take a COVID test today which came back negative.  He tells me yesterday since his symptoms have been a bit better he has been able to eat chicken noodle soup as well as drink fluids without any  vomiting.  He denies any fever or feeling feverish.  Past Medical History:  Diagnosis Date   ADHD (attention deficit hyperactivity disorder)    Allergic rhinitis, cause unspecified 09/19/2011   Asthma 09/19/2011   Auditory processing disorder    Depression    Dysgraphia    Glucose intolerance (pre-diabetes) 2011   Morbid obesity (HCC)       Family History  Problem Relation Age of Onset   Diabetes Mother    Clotting disorder Mother 97       prothromb 2   Arthritis Mother    Arthritis Other        Parent & Grandparent   Ovarian cancer Other        grandmother   Heart disease Other        grandparent   Stroke Other        grandparent   Hypertension Other        grandparent   Diabetes Other        grandparent    Social History   Social History Narrative   Not on file   Social History   Tobacco Use   Smoking status: Never   Smokeless tobacco: Never  Substance Use Topics   Alcohol use: No     Current Meds  Medication Sig   busPIRone (BUSPAR) 15 MG tablet Take 1 tablet (15 mg total) by mouth 2 (two) times daily as needed.   escitalopram (LEXAPRO) 20  MG tablet TAKE 1 TABLET(20 MG) BY MOUTH DAILY   irbesartan (AVAPRO) 150 MG tablet TAKE 1 TABLET(150 MG) BY MOUTH DAILY   Multiple Vitamins-Minerals (CENTRUM MEN PO) Take 1 tablet by mouth daily.   omeprazole (PRILOSEC) 40 MG capsule TAKE 1 CAPSULE(40 MG) BY MOUTH DAILY   ondansetron (ZOFRAN) 4 MG tablet Take 1 tablet (4 mg total) by mouth 2 (two) times daily as needed for nausea or vomiting.    ROS:  Review of Systems  Constitutional:  Positive for malaise/fatigue. Negative for chills and fever.  Eyes:        (+) tunnel vision with lightheadedness that is intermittent, will last for 5-10 will proceed nausea, but no longer vomiting.   Respiratory:  Negative for cough and shortness of breath.   Cardiovascular:  Negative for chest pain.  Gastrointestinal:  Positive for abdominal pain (cramping (5-6/10); with the  loose stools), diarrhea (loose stools), nausea and vomiting (has now resolved). Negative for blood in stool.  Neurological:  Positive for dizziness and headaches. Negative for loss of consciousness.    Objective:   Today's Vitals: Temp 98.2 F (36.8 C)  Vitals with BMI 03/16/2021 02/22/2021 01/08/2021  Height - 6\' 2"  6\' 2"   Weight - 288 lbs 282 lbs  BMI - 36.96 36.19  Systolic (No Data) 152 130  Diastolic (No Data) 86 82  Pulse - 70 70     Physical Exam Comprehensive physical exam not completed today as office visit was conducted remotely.  He appeared fairly well over the phone.  He was able to talk in complete sentences and did not appear diaphoretic or to be in significant distress or pain..  Patient was alert and oriented, and appeared to have appropriate judgment.       Assessment and Plan   1. Gastroenteritis      Plan: 1.  I think he is experiencing an acute viral gastroenteritis.  We will treat symptomatically for now with Zofran as needed for nausea and he was encouraged to drink fluids.  We also discussed that if his symptoms get worse especially if he spikes a fever, starts vomiting more regularly, or experiences worsening abdominal pain he needs to go to the emergency department.  He tells me he understands.  He was out of work yesterday today and probably will be out tomorrow and is requesting a work note.  We will provide this to him through his MyChart.   Tests ordered No orders of the defined types were placed in this encounter.     Meds ordered this encounter  Medications   ondansetron (ZOFRAN) 4 MG tablet    Sig: Take 1 tablet (4 mg total) by mouth 2 (two) times daily as needed for nausea or vomiting.    Dispense:  20 tablet    Refill:  0    Order Specific Question:   Supervising Provider    Answer:       Patient to follow-up as needed.  Pincus Sanes, NP

## 2021-03-16 NOTE — Telephone Encounter (Signed)
Pt has been scheduled for a VV with Renda Rolls, NP today at 9.40am.

## 2021-08-23 ENCOUNTER — Telehealth (INDEPENDENT_AMBULATORY_CARE_PROVIDER_SITE_OTHER): Payer: 59 | Admitting: Family Medicine

## 2021-08-23 DIAGNOSIS — U071 COVID-19: Secondary | ICD-10-CM | POA: Diagnosis not present

## 2021-08-23 MED ORDER — BENZONATATE 100 MG PO CAPS: ORAL_CAPSULE | ORAL | 0 refills | Status: DC

## 2021-08-23 MED ORDER — ALBUTEROL SULFATE HFA 108 (90 BASE) MCG/ACT IN AERS: 2.0000 | INHALATION_SPRAY | Freq: Four times a day (QID) | RESPIRATORY_TRACT | 0 refills | Status: DC | PRN

## 2021-08-23 NOTE — Progress Notes (Signed)
Virtual Visit via Video Note ? ?I connected with Ethan Cruz ? on 08/23/21 at 11:00 AM EDT by a video enabled telemedicine application and verified that I am speaking with the correct person using two identifiers. ? Location patient: [place] ?Location provider:work or home office ?Persons participating in the virtual visit: patient, provider ? ?I discussed the limitations and requested verbal permission for telemedicine visit. The patient expressed understanding and agreed to proceed. ? ? ?HPI: ? ?Acute telemedicine visit for Covid19: ?-Onset: yesterday, tested positive for covid ?-Symptoms include: nasal congestion, chills yesterday, headache, cough, ok SOB if active, mild wheezing ?-Denies: fever, inability to eat/drink ?-Pertinent past medical history: see below - has diagnosis of asthma - reports he tends to get some wheezing/sob with resp illnesses ?-Pertinent medication allergies:No Known Allergies ?-COVID-19 vaccine status: had J and J and one booster of moderna ?Immunization History  ?Administered Date(s) Administered  ? Influenza Split 02/26/2011, 05/27/2012  ? Influenza,inj,Quad PF,6+ Mos 02/10/2013, 03/07/2015, 03/21/2016, 03/31/2017, 03/06/2018, 02/11/2019  ? Influenza-Unspecified 02/08/2014, 03/31/2017  ? Janssen (J&J) SARS-COV-2 Vaccination 09/20/2019  ? PPD Test 12/13/2013  ? Tdap 12/22/2003, 12/13/2013  ? ? ? ?ROS: See pertinent positives and negatives per HPI. ? ?Past Medical History:  ?Diagnosis Date  ? ADHD (attention deficit hyperactivity disorder)   ? Allergic rhinitis, cause unspecified 09/19/2011  ? Asthma 09/19/2011  ? Auditory processing disorder   ? Depression   ? Dysgraphia   ? Glucose intolerance (pre-diabetes) 2011  ? Morbid obesity (HCC)   ? ? ?Past Surgical History:  ?Procedure Laterality Date  ? NO PAST SURGERIES    ? WISDOM TOOTH EXTRACTION    ? ? ? ?Current Outpatient Medications:  ?  albuterol (PROAIR HFA) 108 (90 Base) MCG/ACT inhaler, Inhale 2 puffs into the lungs every 6 (six) hours as needed  for wheezing or shortness of breath., Disp: 1 each, Rfl: 0 ?  benzonatate (TESSALON PERLES) 100 MG capsule, 1-2 capsules up to twice daily as needed for cough, Disp: 30 capsule, Rfl: 0 ?  busPIRone (BUSPAR) 15 MG tablet, Take 1 tablet (15 mg total) by mouth 2 (two) times daily as needed., Disp: 60 tablet, Rfl: 2 ?  escitalopram (LEXAPRO) 20 MG tablet, TAKE 1 TABLET(20 MG) BY MOUTH DAILY, Disp: 90 tablet, Rfl: 2 ?  irbesartan (AVAPRO) 150 MG tablet, TAKE 1 TABLET(150 MG) BY MOUTH DAILY, Disp: 90 tablet, Rfl: 2 ?  Multiple Vitamins-Minerals (CENTRUM MEN PO), Take 1 tablet by mouth daily., Disp: , Rfl:  ?  omeprazole (PRILOSEC) 40 MG capsule, TAKE 1 CAPSULE(40 MG) BY MOUTH DAILY, Disp: 90 capsule, Rfl: 2 ?  ondansetron (ZOFRAN) 4 MG tablet, Take 1 tablet (4 mg total) by mouth 2 (two) times daily as needed for nausea or vomiting., Disp: 20 tablet, Rfl: 0 ? ?EXAM: ? ?VITALS per patient if applicable: ? ?GENERAL: alert, oriented, appears well and in no acute distress ? ?HEENT: atraumatic, conjunttiva clear, no obvious abnormalities on inspection of external nose and ears ? ?NECK: normal movements of the head and neck ? ?LUNGS: on inspection no signs of respiratory distress, breathing rate appears normal, no obvious gross SOB, gasping or wheezing ? ?CV: no obvious cyanosis ? ?MS: moves all visible extremities without noticeable abnormality ? ?PSYCH/NEURO: pleasant and cooperative, no obvious depression or anxiety, speech and thought processing grossly intact ? ?ASSESSMENT AND PLAN: ? ?Discussed the following assessment and plan: ? ?COVID-19 ? ? Discussed treatment options and risk of drug interactions, ideal treatment window, potential complications, isolation and precautions for COVID-19.  The patient declined antiviral at this time. The patient did want a prescription for cough, Tessalon Rx sent.  Also opted to try albuterol 2 puffs q 6 hours prn. Proper use of inhaler demonstrated. Other symptomatic care measures  summarized in patient instructions. ?Work/School slipped offered: provided in patient instructions   ?Advised to seek prompt virtual visit or in person care if worsening, new symptoms arise, or if is not improving with treatment as expected per our conversation of expected course. Discussed options for follow up care. Did let this patient know that I do telemedicine on Tuesdays and Thursdays for [place] and those are the days I am logged into the system. Advised to schedule follow up visit with PCP, [place] virtual visits or [facility] if any further questions or concerns to avoid delays in care. ?  ?I discussed the assessment and treatment plan with the patient. The patient was provided an opportunity to ask questions and all were answered. The patient agreed with the plan and demonstrated an understanding of the instructions. ?  ? ? ?Terressa Koyanagi, DO  ? ?

## 2021-08-23 NOTE — Patient Instructions (Addendum)
? ? ? ?--------------------------------------------------------------------------------------------------------------------------- ? ? ? ?  WORK SLIP: ? ?Patient Ethan Cruz,  05/18/1996, was seen for a medical visit today, 08/23/21 . Please excuse from work for a COVID illness. Most patients will remain contagious for 7-10 days from the onset of symptoms (08/22/21). Will defer to employer for a sooner return to work if patient has 2 negative covid tests 48 hours apart and is feeling better, or if symptoms have resolved, it is greater than 5 days since the positive test and the patient can wear a high-quality, tight fitting mask such as N95 or KN95 at all times for an additional 5 days.  ? ?Sincerely: ?E-signature: Dr. Kriste Basque, DO ?[facility] [place] ?Ph: [phone] ? ? ?------------------------------------------------------------------------------------------------------------------------------ ? ? ?HOME CARE TIPS: ? ? ?-I sent the medication(s) we discussed to your pharmacy: ?Meds ordered this encounter  ?Medications  ? albuterol (PROAIR HFA) 108 (90 Base) MCG/ACT inhaler  ?  Sig: Inhale 2 puffs into the lungs every 6 (six) hours as needed for wheezing or shortness of breath.  ?  Dispense:  1 each  ?  Refill:  0  ? benzonatate (TESSALON PERLES) 100 MG capsule  ?  Sig: 1-2 capsules up to twice daily as needed for cough  ?  Dispense:  30 capsule  ?  Refill:  0  ?  ? ? ?-can use tylenol or aleve if needed for fevers, aches and pains per instructions ? ?-can use nasal saline a few times per day if you have nasal congestion; sometimes  a short course of Afrin nasal spray for 3 days can help with symptoms as well ? ?-stay hydrated, drink plenty of fluids and eat small healthy meals - avoid dairy ? ?-can take 1000 IU ( ) Vit D3 and 100-500 mg of Vit C daily per instructions ? ?-If the Covid test is positive, check out the [facility] website for more information on home care, transmission and treatment for  COVID19 ? ?-follow up with your doctor in 2-3 days unless improving and feeling better ? ?-stay home while sick, except to seek medical care. If you have COVID19, you will likely be contagious for 7-10 days. Flu or Influenza is likely contagious for about 7 days. Other respiratory viral infections remain contagious for 5-10+ days depending on the virus and many other factors. Wear a good mask that fits snugly (such as N95 or KN95) if around others to reduce the risk of transmission. ? ?It was nice to meet you today, and I really hope you are feeling better soon. I help [place] out with telemedicine visits on Tuesdays and Thursdays and am happy to help if you need a follow up virtual visit on those days. Otherwise, if you have any concerns or questions following this visit please schedule a follow up visit with your Primary Care doctor or seek care at a local urgent care clinic to avoid delays in care.  ? ? ?Seek in person care or schedule a follow up video visit promptly if your symptoms worsen, new concerns arise or you are not improving with treatment. Call 911 and/or seek emergency care if your symptoms are severe or life threatening. ? ? ?

## 2021-09-15 ENCOUNTER — Telehealth: Payer: Self-pay | Admitting: Family Medicine

## 2021-09-17 NOTE — Telephone Encounter (Signed)
If patient still not feeling well...please assist in scheduling appt so we can determine best treatment. Thanks! ?

## 2021-09-17 NOTE — Telephone Encounter (Signed)
Spoke with the patient and informed him of the message below.  Patient stated he is feeling better but often notices shortness of breath, would like labs done and agreed to contact PCP's office for an appt. ?

## 2021-09-17 NOTE — Telephone Encounter (Signed)
Unable to leave a message due to voicemail being full. 

## 2021-09-17 NOTE — Telephone Encounter (Signed)
Patient returned call, stated that he would like refill if possible. CMA was unavailable ? ? ? ? ? ?Please advise  ?

## 2021-10-04 ENCOUNTER — Ambulatory Visit: Payer: 59 | Admitting: Sports Medicine

## 2021-10-31 NOTE — Progress Notes (Unsigned)
[facility] [address] [place] [address] Phone: [phone] Subjective:   INadine Counts, am serving as a scribe for Dr. Antoine Primas.  I'm seeing this patient by the request  of:  Etta Grandchild, MD  CC: back pain f/u  IOM:[id number]  Ethan Cruz is a 26 y.o. male coming in with complaint of back pain. Patient states back pain for months. Burning sometimes in thoracic. LBP mainly and radiating pain down to feet. Feet go numb when sitting cross legged. Tens unit did more damage. Tried topicals heat and ice.       Past Medical History:  Diagnosis Date   ADHD (attention deficit hyperactivity disorder)    Allergic rhinitis, cause unspecified 09/19/2011   Asthma 09/19/2011   Auditory processing disorder    Depression    Dysgraphia    Glucose intolerance (pre-diabetes) 2011   Morbid obesity (HCC)    Past Surgical History:  Procedure Laterality Date   NO PAST SURGERIES     WISDOM TOOTH EXTRACTION     Social History   Socioeconomic History   Marital status: Single    Spouse name: Not on file   Number of children: Not on file   Years of education: Not on file   Highest education level: Not on file  Occupational History   Not on file  Tobacco Use   Smoking status: Never   Smokeless tobacco: Never  Substance and Sexual Activity   Alcohol use: No   Drug use: No   Sexual activity: Not Currently    Birth control/protection: Abstinence  Other Topics Concern   Not on file  Social History Narrative   Not on file   Social Determinants of Health   Financial Resource Strain: Not on file  Food Insecurity: Not on file  Transportation Needs: Not on file  Physical Activity: Not on file  Stress: Not on file  Social Connections: Not on file   No Known Allergies Family History  Problem Relation Age of Onset   Diabetes Mother    Clotting disorder Mother 74       prothromb 2   Arthritis Mother    Arthritis Other        Parent &  Grandparent   Ovarian cancer Other        grandmother   Heart disease Other        grandparent   Stroke Other        grandparent   Hypertension Other        grandparent   Diabetes Other        grandparent     Current Outpatient Medications (Cardiovascular):    irbesartan (AVAPRO) 150 MG tablet, TAKE 1 TABLET(150 MG) BY MOUTH DAILY  Current Outpatient Medications (Respiratory):    albuterol (PROAIR HFA) 108 (90 Base) MCG/ACT inhaler, Inhale 2 puffs into the lungs every 6 (six) hours as needed for wheezing or shortness of breath.   benzonatate (TESSALON PERLES) 100 MG capsule, 1-2 capsules up to twice daily as needed for cough  Current Outpatient Medications (Analgesics):    meloxicam (MOBIC) 15 MG tablet, Take 1 tablet (15 mg total) by mouth daily.   Current Outpatient Medications (Other):    tiZANidine (ZANAFLEX) 4 MG tablet, Take 1 tablet (4 mg total) by mouth at bedtime as needed for muscle spasms.   busPIRone (BUSPAR) 15 MG tablet, Take 1 tablet (15 mg total) by mouth 2 (two) times daily as needed.  escitalopram (LEXAPRO) 20 MG tablet, TAKE 1 TABLET(20 MG) BY MOUTH DAILY   Multiple Vitamins-Minerals (CENTRUM MEN PO), Take 1 tablet by mouth daily.   omeprazole (PRILOSEC) 40 MG capsule, TAKE 1 CAPSULE(40 MG) BY MOUTH DAILY   ondansetron (ZOFRAN) 4 MG tablet, Take 1 tablet (4 mg total) by mouth 2 (two) times daily as needed for nausea or vomiting.   Review of Systems:  No headache, visual changes, nausea, vomiting, diarrhea, constipation, dizziness, abdominal pain, skin rash, fevers, chills, night sweats, weight loss, swollen lymph nodes, body aches, joint swelling, chest pain, shortness of breath, mood changes. POSITIVE muscle aches  Objective  Blood pressure 126/84, pulse 80, height 6\' 2"  (1.88 m), weight 270 lb (122.5 kg), SpO2 97 %.   General: No apparent distress alert and oriented x3 mood and affect normal, dressed appropriately.  HEENT: Pupils equal, extraocular  movements intact  Respiratory: Patient's speak in full sentences and does not appear short of breath  Cardiovascular: No lower extremity edema, non tender, no erythema  Gait normal with good balance and coordination.  MSK:  back exam shows loss of lordosis  Tenderness noted with FABER testing right greater than the left. Patient is negative straight leg test at the moment.  Tender to palpation paraspinal musculature   Osteopathic findings C5 flexed rotated and side bent right T3 extended rotated and side bent right inhaled third rib T9 extended rotated and side bent left L2 flexed rotated and side bent right Sacrum right on right    Impression and Recommendations:     The above documentation has been reviewed and is accurate and complete , DO

## 2021-11-01 ENCOUNTER — Ambulatory Visit (INDEPENDENT_AMBULATORY_CARE_PROVIDER_SITE_OTHER): Payer: 59 | Admitting: Family Medicine

## 2021-11-01 ENCOUNTER — Ambulatory Visit: Payer: 59 | Admitting: Family Medicine

## 2021-11-01 ENCOUNTER — Ambulatory Visit (INDEPENDENT_AMBULATORY_CARE_PROVIDER_SITE_OTHER): Payer: 59

## 2021-11-01 VITALS — BP 126/84 | HR 80 | Ht 74.0 in | Wt 270.0 lb

## 2021-11-01 DIAGNOSIS — M5126 Other intervertebral disc displacement, lumbar region: Secondary | ICD-10-CM

## 2021-11-01 DIAGNOSIS — M9908 Segmental and somatic dysfunction of rib cage: Secondary | ICD-10-CM | POA: Diagnosis not present

## 2021-11-01 DIAGNOSIS — M9904 Segmental and somatic dysfunction of sacral region: Secondary | ICD-10-CM

## 2021-11-01 DIAGNOSIS — M9902 Segmental and somatic dysfunction of thoracic region: Secondary | ICD-10-CM

## 2021-11-01 DIAGNOSIS — M549 Dorsalgia, unspecified: Secondary | ICD-10-CM

## 2021-11-01 DIAGNOSIS — M9901 Segmental and somatic dysfunction of cervical region: Secondary | ICD-10-CM

## 2021-11-01 DIAGNOSIS — M9903 Segmental and somatic dysfunction of lumbar region: Secondary | ICD-10-CM

## 2021-11-01 MED ORDER — TIZANIDINE HCL 4 MG PO TABS: 4.0000 mg | ORAL_TABLET | Freq: Every evening | ORAL | 0 refills | Status: DC | PRN

## 2021-11-01 MED ORDER — MELOXICAM 15 MG PO TABS: 15.0000 mg | ORAL_TABLET | Freq: Every day | ORAL | 0 refills | Status: DC

## 2021-11-01 NOTE — Assessment & Plan Note (Addendum)
Loss of lordosis patient has had some follow-up for strength.  Discussed Recommended home exercises, which activities to do and which ones to avoid, increase activity slowly over the course of the next several weeks.  Does likely will respond well to meloxicam and Zanaflex when needed.  Attempted osteopathic manipulation.  will follow-up again 8 weeks

## 2021-11-01 NOTE — Assessment & Plan Note (Signed)

## 2021-11-01 NOTE — Patient Instructions (Addendum)
Xray today Do prescribed exercises at least 3x a week Meloxicam for 10 days then as needed Zanaflex 4mg  at night as needed See you again in 2-3 months

## 2021-11-29 ENCOUNTER — Telehealth (INDEPENDENT_AMBULATORY_CARE_PROVIDER_SITE_OTHER): Payer: 59 | Admitting: Nurse Practitioner

## 2021-11-29 ENCOUNTER — Encounter: Payer: Self-pay | Admitting: Nurse Practitioner

## 2021-11-29 DIAGNOSIS — F419 Anxiety disorder, unspecified: Secondary | ICD-10-CM | POA: Diagnosis not present

## 2021-11-29 MED ORDER — HYDROXYZINE PAMOATE 25 MG PO CAPS: 25.0000 mg | ORAL_CAPSULE | Freq: Three times a day (TID) | ORAL | 2 refills | Status: DC | PRN

## 2021-11-29 NOTE — Progress Notes (Signed)
   Established Patient Office Visit  An audio/visual tele-health visit was completed today for this patient. I connected with  [name] on 11/29/21 utilizing audio/visual technology and verified that I am speaking with the correct person using two identifiers. The patient was located at their home, and I was located at the office of [facility] at [facility] during the encounter. I discussed the limitations of evaluation and management by telemedicine. The patient expressed understanding and agreed to proceed.     Subjective   Patient ID: Ethan Cruz, male    DOB: 06/07/1996  Age: 26 y.o. MRN: [id number]  Chief Complaint  Patient presents with   Anxiety    Patient arrives today for virtual visit for the above.  He reports that he has been having loose stools for the last 3 days.  He believes this is related to his anxiety.  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  This is been causing him increased anxiety.  He is currently on Lexapro 20 mg by mouth daily and buspirone 15 mg twice a day as needed.  He denies suicidal ideation.  He denies any recent sick contacts, eating at a restaurant, or recent travel.  He would like to discuss adjusting his anxiety medication.    Review of Systems  Constitutional:  Negative for chills and fever.  Gastrointestinal:  Positive for abdominal pain and diarrhea. Negative for blood in stool, melena, nausea and vomiting.       (-) anorexia  Psychiatric/Behavioral:  Negative for depression and suicidal ideas. The patient is nervous/anxious.       Objective:     There were no vitals taken for this visit. BP Readings from Last 3 Encounters:  11/01/21 126/84  02/22/21 (!) 152/86  01/08/21 130/82   Wt Readings from Last 3 Encounters:  11/01/21 270 lb (122.5 kg)  02/22/21 288 lb (130.6 kg)  01/08/21 282 lb (127.9 kg)      Physical Exam Comprehensive physical exam not completed today  as office visit was conducted remotely.  Patient appears well over the phone.  Patient was alert and oriented, and appeared to have appropriate judgment.   No results found for any visits on 11/29/21.    The ASCVD Risk score (Arnett DK, et al., 2019) failed to calculate for the following reasons:   The 2019 ASCVD risk score is only valid for ages 62 to 24    Assessment & Plan:   Problem List Items Addressed This Visit       Other   Anxiety - Primary    Chronic, uncontrolled.  Appears situational and related to his current employment which is changing shortly.  Discontinue buspirone.  Continue Lexapro 20 mg a mouth daily.  Start hydroxyzine as needed -patient warned to avoid driving or operating heavy machinery while taking medication until he knows how makes him feel.  He reports his understanding.  Patient overdue for labs, labs currently active in system.  Patient told to follow-up within the next week to lab to have blood work drawn.  He reports his understanding and plans to do so.  Consider referral to psychiatry if mood remains uncontrolled despite medication adjustment.      Relevant Medications   hydrOXYzine (VISTARIL) 25 MG capsule    Return if symptoms worsen or fail to improve.    Elenore Paddy, NP

## 2021-11-29 NOTE — Assessment & Plan Note (Signed)
Chronic, uncontrolled.  Appears situational and related to his current employment which is changing shortly.  Discontinue buspirone.  Continue Lexapro 20 mg a mouth daily.  Start hydroxyzine as needed -patient warned to avoid driving or operating heavy machinery while taking medication until he knows how makes him feel.  He reports his understanding.  Patient overdue for labs, labs currently active in system.  Patient told to follow-up within the next week to lab to have blood work drawn.  He reports his understanding and plans to do so.  Consider referral to psychiatry if mood remains uncontrolled despite medication adjustment.

## 2021-11-30 ENCOUNTER — Encounter: Payer: Self-pay | Admitting: Nurse Practitioner

## 2021-12-05 ENCOUNTER — Encounter: Payer: Self-pay | Admitting: Nurse Practitioner

## 2021-12-06 ENCOUNTER — Other Ambulatory Visit: Payer: 59

## 2021-12-19 NOTE — Progress Notes (Deleted)
  [facility] [address] [place] [address] Phone: [phone] Subjective:    I'm seeing this patient by the request  of:  Etta Grandchild, MD  CC: neck and back pain   IDP:[id number]  Ethan Cruz is a 26 y.o. male coming in with complaint of back and neck pain. OMT 11/01/2021. Patient states   Medications patient has been prescribed: Meloxicam, Zanaflex  Taking:         Reviewed prior external information including notes and imaging from previsou exam, outside providers and external EMR if available.   As well as notes that were available from care everywhere and other healthcare systems.  Past medical history, social, surgical and family history all reviewed in electronic medical record.  No pertanent information unless stated regarding to the chief complaint.   Past Medical History:  Diagnosis Date   ADHD (attention deficit hyperactivity disorder)    Allergic rhinitis, cause unspecified 09/19/2011   Asthma 09/19/2011   Auditory processing disorder    Depression    Dysgraphia    Glucose intolerance (pre-diabetes) 2011   Morbid obesity (HCC)     No Known Allergies   Review of Systems:  No headache, visual changes, nausea, vomiting, diarrhea, constipation, dizziness, abdominal pain, skin rash, fevers, chills, night sweats, weight loss, swollen lymph nodes, body aches, joint swelling, chest pain, shortness of breath, mood changes. POSITIVE muscle aches  Objective  There were no vitals taken for this visit.   General: No apparent distress alert and oriented x3 mood and affect normal, dressed appropriately.  HEENT: Pupils equal, extraocular movements intact  Respiratory: Patient's speak in full sentences and does not appear short of breath  Cardiovascular: No lower extremity edema, non tender, no erythema  Gait MSK:  Back   Osteopathic findings  C2 flexed rotated and side bent right C6 flexed rotated and side bent left T3  extended rotated and side bent right inhaled rib T9 extended rotated and side bent left L2 flexed rotated and side bent right Sacrum right on right       Assessment and Plan:  No problem-specific Assessment & Plan notes found for this encounter.    Nonallopathic problems  Decision today to treat with OMT was based on Physical Exam  After verbal consent patient was treated with HVLA, ME, FPR techniques in cervical, rib, thoracic, lumbar, and sacral  areas  Patient tolerated the procedure well with improvement in symptoms  Patient given exercises, stretches and lifestyle modifications  See medications in patient instructions if given  Patient will follow up in 4-8 weeks     The above documentation has been reviewed and is accurate and complete Judi Saa, DO         Note: This dictation was prepared with Dragon dictation along with smaller phrase technology. Any transcriptional errors that result from this process are unintentional.

## 2021-12-20 ENCOUNTER — Ambulatory Visit: Payer: 59 | Admitting: Family Medicine

## 2022-03-24 IMAGING — US US SCROTUM
1 series · 14 of 25 positions shown · non-contrast
Comparison: None.

CLINICAL DATA: New tender masses in the upper and lower poles of
the left testicle.

EXAM:
ULTRASOUND OF SCROTUM
TECHNIQUE: Complete ultrasound examination of the testicles, epididymis, and
other scrotal structures was performed.

[Series 1: us scrotum · 0.08mm/px · 14 of 50 slices shown]
[im 1/50]
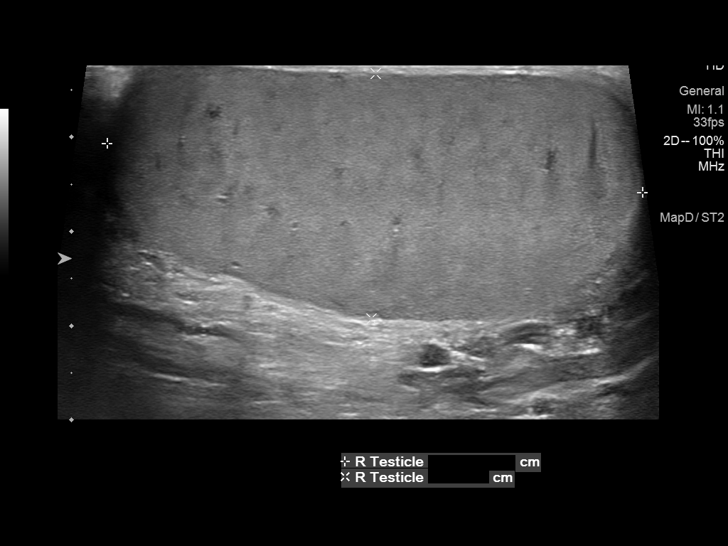
[im 5/50]
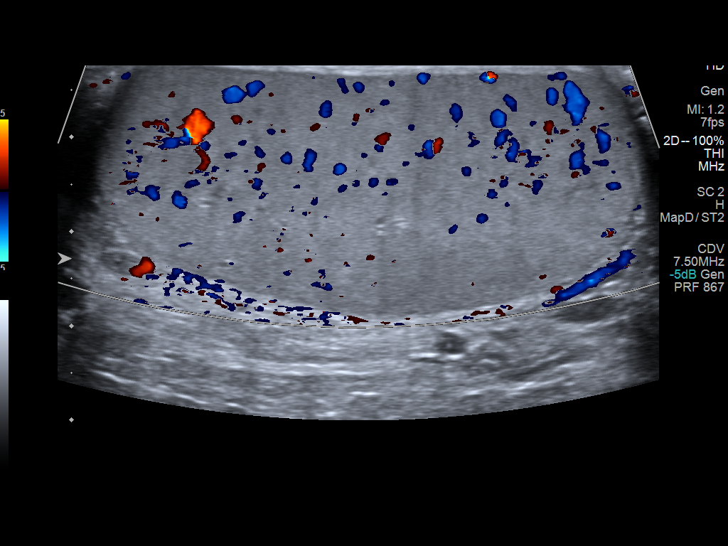
[im 9/50]
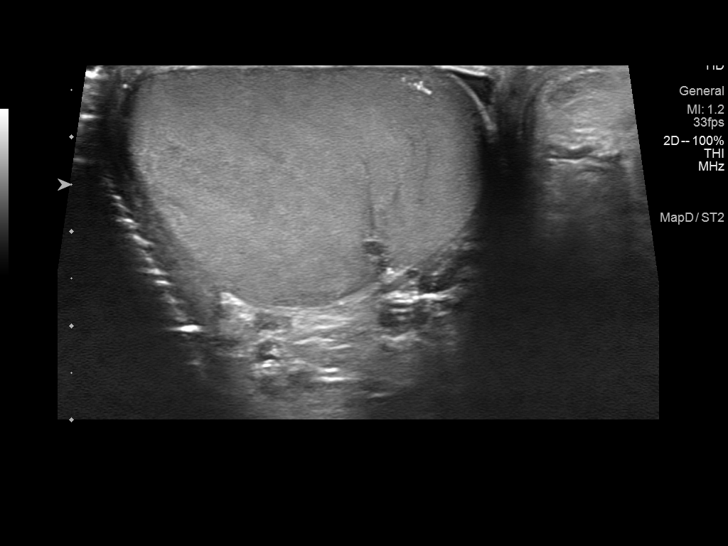
[im 13/50]
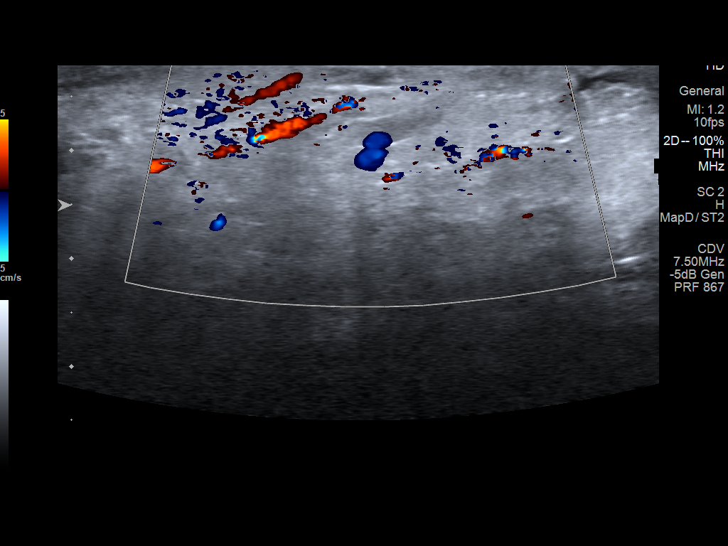
[im 17/50]
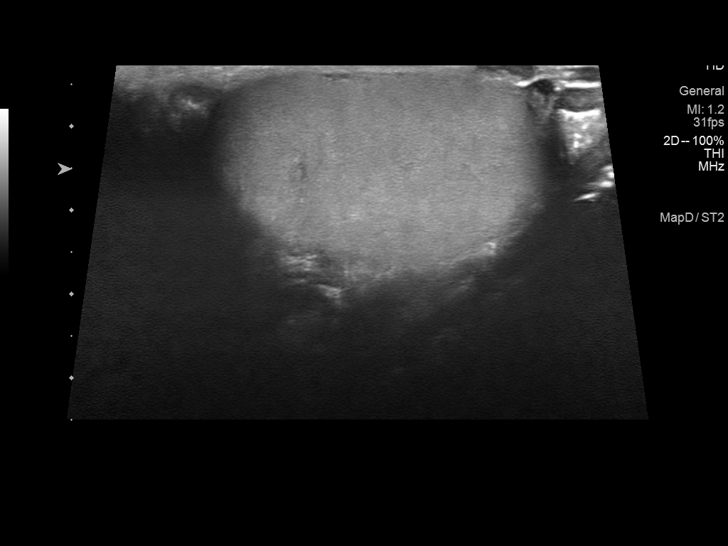
[im 19/50]
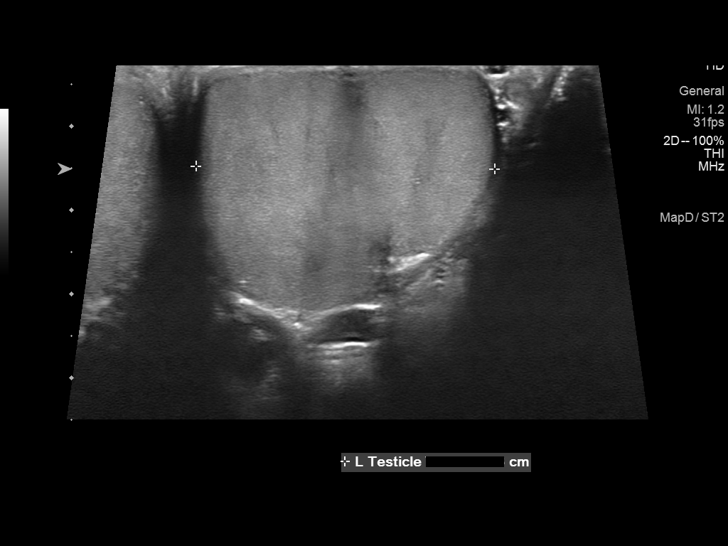
[im 23/50]
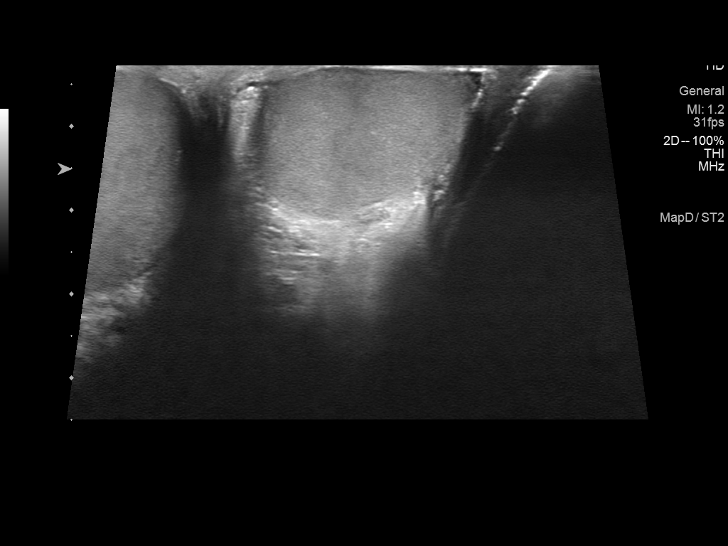
[im 27/50]
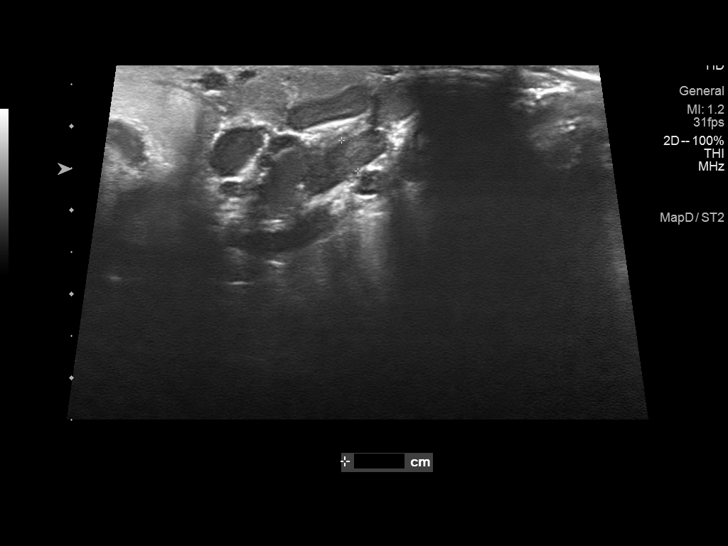
[im 31/50]
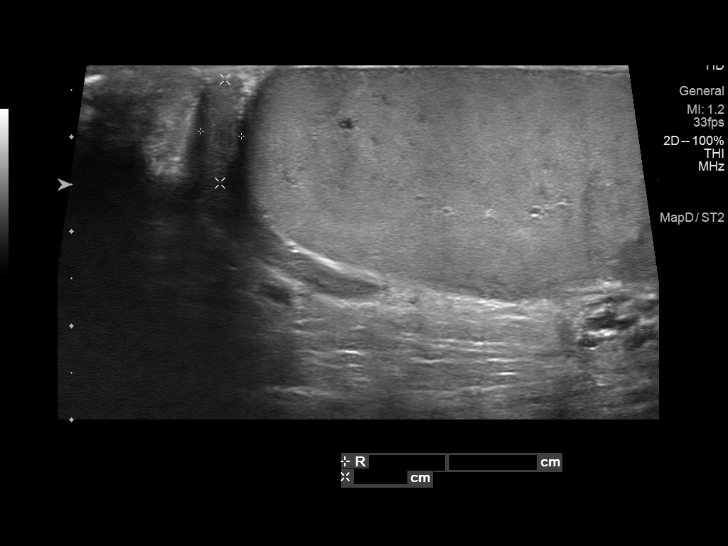
[im 33/50]
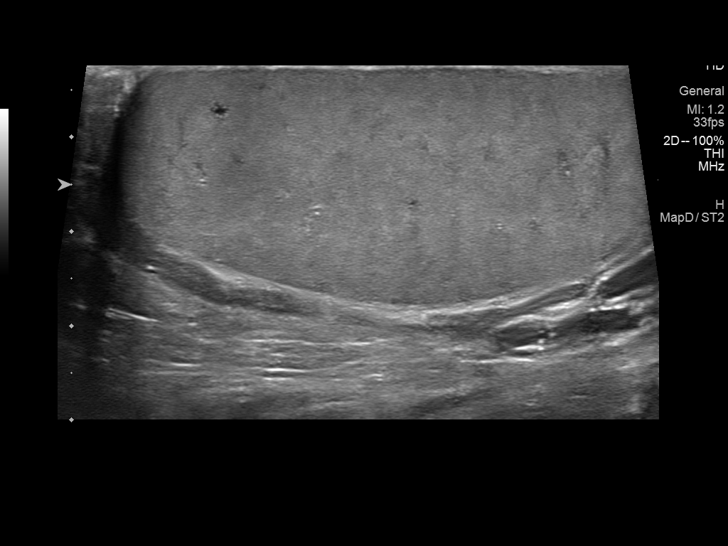
[im 37/50]
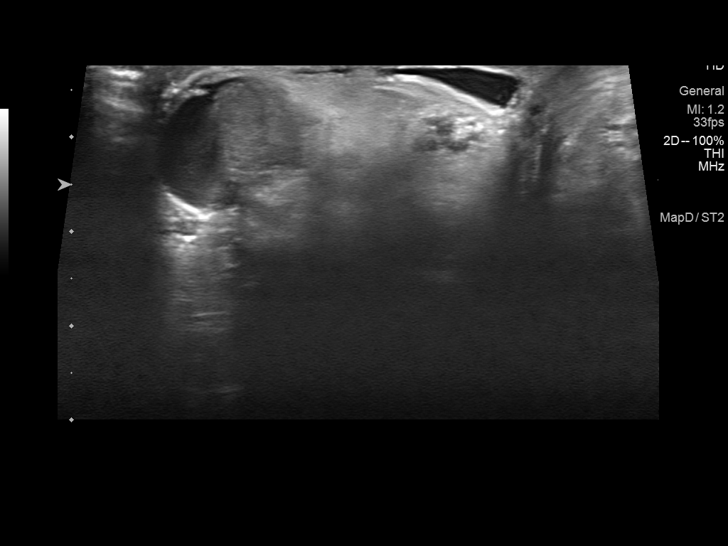
[im 41/50]
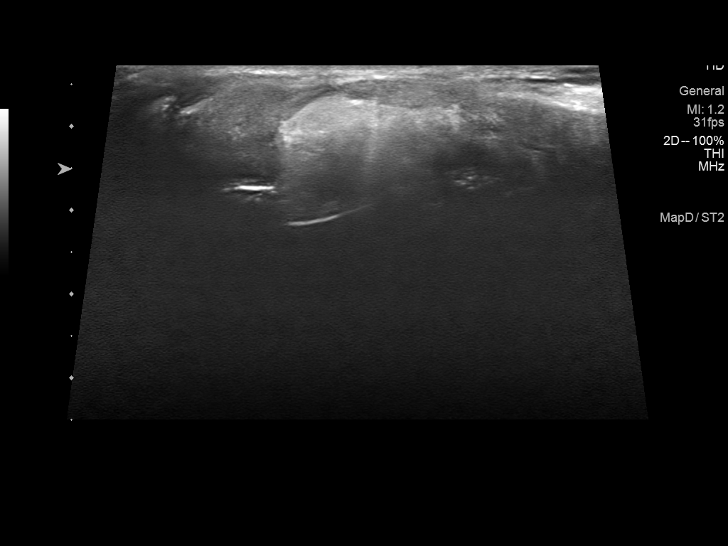
[im 45/50]
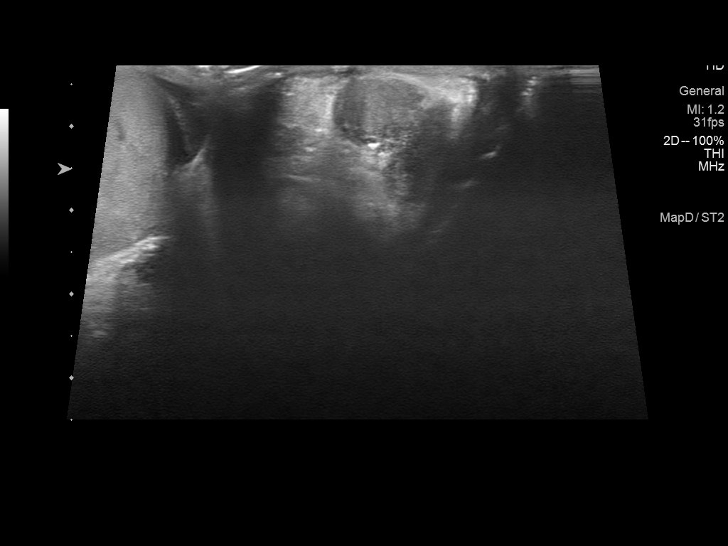
[im 50/50]
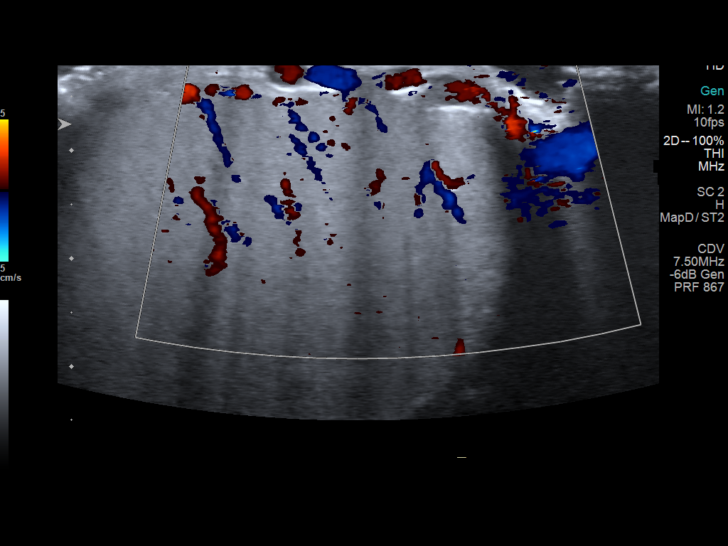

[14 of 25 positions shown; findings below may reference images not displayed]

FINDINGS: Right testicle

Measurements: 5.7 x 2.6 x 3.6 cm. No mass visualized. Scattered
microlithiasis.

Left testicle

Measurements:  5.9 x 2.9 x 3.6 cm.  Scattered microlithiasis.

Right epididymis:  Normal in size and appearance.

Left epididymis:  Normal in size and appearance.

Hydrocele:  None visualized.

Varicocele: Prominent superficial vessels in the anterior and
inferior left testicle at the patient's area of palpable concern
(images 49-52), most likely varicocele
IMPRESSION: 1. Prominent veins correlate with the patient's area of palpable
concern in the left testicle, most likely varicocele.

2.  No evidence of testicular mass.

## 2022-05-06 ENCOUNTER — Ambulatory Visit (INDEPENDENT_AMBULATORY_CARE_PROVIDER_SITE_OTHER): Payer: BC Managed Care – PPO | Admitting: Family Medicine

## 2022-05-06 ENCOUNTER — Ambulatory Visit: Payer: Self-pay

## 2022-05-06 ENCOUNTER — Other Ambulatory Visit (INDEPENDENT_AMBULATORY_CARE_PROVIDER_SITE_OTHER): Payer: BC Managed Care – PPO

## 2022-05-06 VITALS — BP 138/88 | HR 86 | Ht 74.0 in | Wt 207.0 lb

## 2022-05-06 DIAGNOSIS — M25511 Pain in right shoulder: Secondary | ICD-10-CM

## 2022-05-06 DIAGNOSIS — E559 Vitamin D deficiency, unspecified: Secondary | ICD-10-CM

## 2022-05-06 DIAGNOSIS — G5601 Carpal tunnel syndrome, right upper limb: Secondary | ICD-10-CM | POA: Diagnosis not present

## 2022-05-06 DIAGNOSIS — G8929 Other chronic pain: Secondary | ICD-10-CM

## 2022-05-06 DIAGNOSIS — R7303 Prediabetes: Secondary | ICD-10-CM

## 2022-05-06 DIAGNOSIS — M545 Low back pain, unspecified: Secondary | ICD-10-CM

## 2022-05-06 LAB — TSH: TSH: 2.01 u[IU]/mL (ref 0.35–5.50)

## 2022-05-06 LAB — CBC WITH DIFFERENTIAL/PLATELET
Basophils Absolute: 0.1 10*3/uL (ref 0.0–0.1)
Basophils Relative: 0.8 % (ref 0.0–3.0)
Eosinophils Absolute: 0.3 10*3/uL (ref 0.0–0.7)
Eosinophils Relative: 4.3 % (ref 0.0–5.0)
HCT: 49 % (ref 39.0–52.0)
Hemoglobin: 16.6 g/dL (ref 13.0–17.0)
Lymphocytes Relative: 28.6 % (ref 12.0–46.0)
Lymphs Abs: 2.3 10*3/uL (ref 0.7–4.0)
MCHC: 33.8 g/dL (ref 30.0–36.0)
MCV: 91.9 fl (ref 78.0–100.0)
Monocytes Absolute: 0.6 10*3/uL (ref 0.1–1.0)
Monocytes Relative: 8.1 % (ref 3.0–12.0)
Neutro Abs: 4.6 10*3/uL (ref 1.4–7.7)
Neutrophils Relative %: 58.2 % (ref 43.0–77.0)
Platelets: 267 10*3/uL (ref 150.0–400.0)
RBC: 5.33 Mil/uL (ref 4.22–5.81)
RDW: 13.4 % (ref 11.5–15.5)
WBC: 7.9 10*3/uL (ref 4.0–10.5)

## 2022-05-06 LAB — URINALYSIS, ROUTINE W REFLEX MICROSCOPIC
Bilirubin Urine: NEGATIVE
Hgb urine dipstick: NEGATIVE
Ketones, ur: NEGATIVE
Leukocytes,Ua: NEGATIVE
Nitrite: NEGATIVE
RBC / HPF: NONE SEEN
Specific Gravity, Urine: 1.02 (ref 1.000–1.030)
Total Protein, Urine: NEGATIVE
Urine Glucose: NEGATIVE
Urobilinogen, UA: 0.2 (ref 0.0–1.0)
pH: 7 (ref 5.0–8.0)

## 2022-05-06 LAB — BASIC METABOLIC PANEL
BUN: 14 mg/dL (ref 6–23)
CO2: 27 mEq/L (ref 19–32)
Calcium: 9.6 mg/dL (ref 8.4–10.5)
Chloride: 103 mEq/L (ref 96–112)
Creatinine, Ser: 0.98 mg/dL (ref 0.40–1.50)
GFR: 106.55 mL/min (ref >60.00)
Glucose, Bld: 104 mg/dL — ABNORMAL HIGH (ref 70–99)
Potassium: 4.1 mEq/L (ref 3.5–5.1)
Sodium: 137 mEq/L (ref 135–145)

## 2022-05-06 LAB — HEPATIC FUNCTION PANEL
ALT: 12 U/L (ref 0–53)
AST: 12 U/L (ref 0–37)
Albumin: 4.8 g/dL (ref 3.5–5.2)
Alkaline Phosphatase: 59 U/L (ref 39–117)
Bilirubin, Direct: 0.2 mg/dL (ref 0.0–0.3)
Total Bilirubin: 0.8 mg/dL (ref 0.2–1.2)
Total Protein: 7.4 g/dL (ref 6.0–8.3)

## 2022-05-06 LAB — LIPID PANEL
Cholesterol: 150 mg/dL (ref 0–200)
HDL: 42.4 mg/dL (ref >39.00)
LDL Cholesterol: 97 mg/dL (ref 0–99)
NonHDL: 107.92
Total CHOL/HDL Ratio: 4
Triglycerides: 55 mg/dL (ref 0.0–149.0)
VLDL: 11 mg/dL (ref 0.0–40.0)

## 2022-05-06 LAB — HEMOGLOBIN A1C: Hgb A1c MFr Bld: 5.7 % (ref 4.6–6.5)

## 2022-05-06 LAB — VITAMIN D 25 HYDROXY (VIT D DEFICIENCY, FRACTURES): VITD: 36.54 ng/mL (ref 30.00–100.00)

## 2022-05-06 MED ORDER — TIZANIDINE HCL 4 MG PO TABS: 4.0000 mg | ORAL_TABLET | Freq: Every evening | ORAL | 0 refills | Status: DC | PRN

## 2022-05-06 NOTE — Patient Instructions (Addendum)
Thank you for coming in today.   You received an injection today. Seek immediate medical attention if the joint becomes red, extremely painful, or is oozing fluid.   I've referred you to Physical Therapy.  Let us know if you don't hear from them in one week.   Use a carpal tunnel wrist brace at night.   Recheck in 6 weeks.

## 2022-05-06 NOTE — Progress Notes (Signed)
I, Ethan Cruz, LAT, ATC acting as a scribe for Ethan Graham, MD.  Ethan Cruz is a 26 y.o. male who presents to [facility] at [facility] today for back and R arm pain. Pt was previously seen by Dr. Katrinka Blazing on 11/01/21 for OMT and was prescribed meloxicam and tizanidine and was given HEP. Today, pt reports R arm pain ongoing since last night. Pt locates pain to around the R scapula w/ radiating pain down his R arm to fingers. Pt is also having pain to the his R shoulder.   UE numbness/tingling: no UE weakness: yes Aggravates: any R arm movements- ADLs Treatments tried: heat, massage, Tylenol  Pt reports LBP that has worsened since his prior visit in May 2023. Pt locates pain to the midline of his thoracic spine. Pt works at [employer] as a Curator.   Dx imaging: 11/01/21 L-spine XR  Pertinent review of systems: No fevers or chills  Relevant historical information: Hypertension.  History of chronic back pain.   Exam:  BP 138/88   Pulse 86   Ht 6\' 2"  (1.88 m)   Wt 207 lb (93.9 kg)   SpO2 97%   BMI 26.58 kg/m  General: Well Developed, well nourished, and in no acute distress.   MSK: C-spine: Normal appearing Nontender spinal midline. Normal cervical motion. Negative Spurling's test.  Upper extremity strength is intact. Reflexes are intact.  Right shoulder: Normal-appearing Nontender. Normal motion pain with abduction and internal rotation. Positive Hawkins and Neer's test.  Positive empty can test. Negative Yergason's and speeds test.  Right hand normal appearing positive Tinel's and Phalen's test carpal tunnel.  Normal grip strength.  T and L-spine: Nontender midline.  Tender palpation paraspinal muscles thoracolumbar area.  Normal thoracic and lumbar motion.    Lab and Radiology Results  Procedure: Real-time Ultrasound Guided Injection of right shoulder subacromial bursa Device: Philips Affiniti 50G Images permanently stored and available for  review in PACS Verbal informed consent obtained.  Discussed risks and benefits of procedure. Warned about infection, bleeding, hyperglycemia damage to structures among others. Patient expresses understanding and agreement Time-out conducted.   Noted no overlying erythema, induration, or other signs of local infection.   Skin prepped in a sterile fashion.   Local anesthesia: Topical Ethyl chloride.   With sterile technique and under real time ultrasound guidance: 40 mg of Kenalog and 2 mL of Marcaine injected into subacromial bursa. Fluid seen entering the bursa.   Completed without difficulty   Pain immediately resolved suggesting accurate placement of the medication.   Advised to call if fevers/chills, erythema, induration, drainage, or persistent bleeding.   Images permanently stored and available for review in the ultrasound unit.  Impression: Technically successful ultrasound guided injection.        Assessment and Plan: 27 y.o. male with multifactorial right shoulder and arm pain.  Majority of the pain I think is due to rotator cuff tendinopathy and bursitis.  Plan for injection today which helped.  Additionally will refer to physical therapy which I think ultimately will be very helpful.  His more distal paresthesias I think are due to carpal tunnel syndrome.  Will try a night splint for carpal tunnel syndrome.  Additionally he has some chronic axial back pain in the thoracolumbar region.  This is an acute exacerbation of a chronic issue thought to be muscle dysfunction.  Again plan for physical therapy and a refill of his tizanidine.  Recheck in 6 weeks.   PDMP  not reviewed this encounter. Orders Placed This Encounter  Procedures   [place] LIMITED JOINT SPACE STRUCTURES UP RIGHT(NO LINKED CHARGES)    Order Specific Question:   Reason for Exam (SYMPTOM  OR DIAGNOSIS REQUIRED)    Answer:   right shoulder pain    Order Specific Question:   Preferred imaging location?    Answer:    [place]   Ambulatory referral to Physical Therapy    Referral Priority:   Routine    Referral Type:   Physical Medicine    Referral Reason:   Specialty Services Required    Requested Specialty:   Physical Therapy    Number of Visits Requested:   1   Meds ordered this encounter  Medications   tiZANidine (ZANAFLEX) 4 MG tablet    Sig: Take 1 tablet (4 mg total) by mouth at bedtime as needed for muscle spasms.    Dispense:  30 tablet    Refill:  0     Discussed warning signs or symptoms. Please see discharge instructions. Patient expresses understanding.   The above documentation has been reviewed and is accurate and complete Lynne Leader, M.D.

## 2022-05-09 ENCOUNTER — Telehealth: Payer: Self-pay | Admitting: Family Medicine

## 2022-05-09 MED ORDER — MELOXICAM 15 MG PO TABS: ORAL_TABLET | ORAL | 1 refills | Status: DC

## 2022-05-09 NOTE — Telephone Encounter (Signed)
Pt seen earlier this week, shoulder is better but he is requesting an anti-inflammatory for his back pain.  [facility]  Pt also mentioned previous issues with kidney function, but labs done this week were normal per pt. This is in regards to prescribing.

## 2022-05-09 NOTE — Telephone Encounter (Signed)
Pt notified of med fill and instructions via VM.

## 2022-05-09 NOTE — Telephone Encounter (Signed)
Meloxicam prescribed.  This is a strong anti-inflammatory that should help with back pain.  Do not take with ibuprofen or Aleve.  Okay to take with Tylenol and the tizanidine that I prescribed earlier.

## 2022-05-15 ENCOUNTER — Ambulatory Visit: Payer: 59 | Admitting: Family Medicine

## 2022-06-02 ENCOUNTER — Other Ambulatory Visit: Payer: Self-pay | Admitting: Family Medicine

## 2022-06-04 NOTE — Telephone Encounter (Signed)
Rx refill request approved per Dr. Corey's orders. 

## 2022-06-14 ENCOUNTER — Ambulatory Visit: Payer: BC Managed Care – PPO | Admitting: Family Medicine

## 2022-06-17 ENCOUNTER — Ambulatory Visit (INDEPENDENT_AMBULATORY_CARE_PROVIDER_SITE_OTHER): Payer: BC Managed Care – PPO | Admitting: Family Medicine

## 2022-06-17 VITALS — BP 122/82 | HR 78 | Ht 74.0 in | Wt 199.0 lb

## 2022-06-17 DIAGNOSIS — M25511 Pain in right shoulder: Secondary | ICD-10-CM
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
## 2022-06-17 NOTE — Patient Instructions (Signed)
Thank you for coming in today.   Glad you are feeling better.  Check back as needed 

## 2022-09-23 ENCOUNTER — Ambulatory Visit (INDEPENDENT_AMBULATORY_CARE_PROVIDER_SITE_OTHER): Payer: BC Managed Care – PPO | Admitting: Family Medicine

## 2022-09-23 ENCOUNTER — Ambulatory Visit (INDEPENDENT_AMBULATORY_CARE_PROVIDER_SITE_OTHER): Payer: BC Managed Care – PPO

## 2022-09-23 VITALS — BP 126/78 | HR 57 | Ht 74.0 in | Wt 189.0 lb

## 2022-09-23 DIAGNOSIS — M545 Low back pain, unspecified: Secondary | ICD-10-CM

## 2022-09-23 DIAGNOSIS — M25562 Pain in left knee: Secondary | ICD-10-CM

## 2022-09-23 DIAGNOSIS — F430 Acute stress reaction: Secondary | ICD-10-CM

## 2022-09-23 MED ORDER — TIZANIDINE HCL 4 MG PO TABS: ORAL_TABLET | ORAL | 1 refills | Status: AC

## 2022-09-23 MED ORDER — MELOXICAM 15 MG PO TABS: ORAL_TABLET | ORAL | 1 refills | Status: DC

## 2022-09-23 MED ORDER — TIZANIDINE HCL 4 MG PO TABS: ORAL_TABLET | ORAL | 1 refills | Status: DC

## 2022-09-23 MED ORDER — MELOXICAM 15 MG PO TABS: ORAL_TABLET | ORAL | 1 refills | Status: AC

## 2022-09-23 NOTE — Addendum Note (Signed)
Addended by: Rodolph Bong on: 09/23/2022 12:42 PM   Modules accepted: Orders
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
## 2022-09-23 NOTE — Patient Instructions (Addendum)
Thank you for coming in today.   Please get an Xray today before you leave    Let me know if the mental side does not settle. More to do. The can turn into PTSD and EMDR therapy could help.  Time helps a lot.    Let me know about PT.   Recheck in 1 month.

## 2022-09-26 NOTE — Progress Notes (Signed)
Left knee x-ray looks normal to radiology

## 2022-09-26 NOTE — Progress Notes (Signed)
Lumbar spine x-ray looks normal to radiology.

## 2023-01-20 ENCOUNTER — Ambulatory Visit (INDEPENDENT_AMBULATORY_CARE_PROVIDER_SITE_OTHER): Payer: BC Managed Care – PPO

## 2023-01-20 ENCOUNTER — Ambulatory Visit (INDEPENDENT_AMBULATORY_CARE_PROVIDER_SITE_OTHER): Payer: BC Managed Care – PPO | Admitting: Student

## 2023-01-20 ENCOUNTER — Encounter (HOSPITAL_BASED_OUTPATIENT_CLINIC_OR_DEPARTMENT_OTHER): Payer: Self-pay | Admitting: Student

## 2023-01-20 ENCOUNTER — Other Ambulatory Visit (HOSPITAL_BASED_OUTPATIENT_CLINIC_OR_DEPARTMENT_OTHER): Payer: Self-pay

## 2023-01-20 ENCOUNTER — Encounter (HOSPITAL_BASED_OUTPATIENT_CLINIC_OR_DEPARTMENT_OTHER): Payer: Self-pay

## 2023-01-20 DIAGNOSIS — M545 Low back pain, unspecified: Secondary | ICD-10-CM | POA: Diagnosis not present

## 2023-01-20 DIAGNOSIS — G8929 Other chronic pain: Secondary | ICD-10-CM

## 2023-01-20 DIAGNOSIS — M542 Cervicalgia: Secondary | ICD-10-CM

## 2023-01-20 MED ORDER — MELOXICAM 15 MG PO TABS
15.0000 mg | ORAL_TABLET | Freq: Every day | ORAL | 0 refills | Status: AC
  Filled 2023-01-20: qty 14, 14d supply, fill #0

## 2023-01-20 MED ORDER — METHOCARBAMOL 500 MG PO TABS
500.0000 mg | ORAL_TABLET | Freq: Four times a day (QID) | ORAL | 0 refills | Status: AC | PRN
  Filled 2023-01-20: qty 56, 14d supply, fill #0
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
## 2023-12-01 ENCOUNTER — Encounter

## 2023-12-01 ENCOUNTER — Ambulatory Visit: Payer: Self-pay

## 2023-12-01 NOTE — Telephone Encounter (Signed)
 Called to [facility] at [facility] and spoke with Keyonna to assist pt to schedule appt.  Pt states he wants to schedule at [facility] & not regular PCP.   FYI Only or Action Required?: FYI only for provider.  Patient was last seen in primary care on 11/29/2021 by Elnor Lauraine BRAVO, NP. Called Nurse Triage reporting Urine Output. Symptoms began yesterday. Interventions attempted: Rest, hydration, or home remedies. Symptoms are: unchanged.  Triage Disposition: See Physician Within 24 Hours  Patient/caregiver understands and will follow disposition?: Yes    pt is showing signs of heat exhaustion. Urine is real dark and smells bad.   Reason for Disposition  [1] MILD fatigue and weakness (e.g., feels weak or tired but able to do most of usual daily activities) AND [2] new onset  Side (flank) or lower back pain present  Answer Assessment - Initial Assessment Questions 1. SYMPTOM: What's the main symptom you're concerned about? (e.g., frequency, incontinence)     Low urine output, dark yellow urine almost orange, burning with urination, bad ordor 2. ONSET: When did the    start?     yesterday 3. PAIN: Is there any pain? If Yes, ask: How bad is it? (Scale: 1-10; mild, moderate, severe)     no 4. CAUSE: What do you think is causing the symptoms?     no 5. OTHER SYMPTOMS: Do you have any other symptoms? (e.g., blood in urine, fever, flank pain, pain with urination)     Pain with urination 6. PREGNANCY: Is there any chance you are pregnant? When was your last menstrual period?     N/a  Pt states no urination between 11 am yesterday until today 0720.  Answer Assessment - Initial Assessment Questions 1. DESCRIPTION: Tell me more about how you (your loved one; patient) are feeling. (e.g., tired, exhausted, lacks energy, feels physically weak)      Pt stated muscle fatigue 2. SEVERITY: How bad is it?  How is the weakness or fatigue affecting your ability to do your usual activities?       - MILD (0-3): Feels weak or tired, but able to engage in most of usual daily activities     - MODERATE (4-7): Able to stand and walk; weakness or fatigue significantly interferes with daily activities     - SEVERE (8-10): Unable to stand or walk; unable to do usual activities     *No Answer* 3. ONSET:  When did these symptoms begin? (e.g., hours, days, weeks, months)     yesterday 4. CAUSE: What do you think is causing the weakness or fatigue? (e.g., poor appetite, not drinking enough fluids, trouble sleeping, a new medicine)     *No Answer* 5. OTHER SYMPTOMS: Do you have any other symptoms? (e.g., bleeding, chest pain, diarrhea, fever, cough, SOB, vomiting)     *No Answer* 6. NEW MEDICINES:  Have you started on any new medicines recently? (e.g., opioid pain medicines, benzodiazepines, muscle relaxants, antidepressants, antihistamines, neuroleptics, beta blockers)     no 7. TREATMENT: Are you taking any medicines to treat weakness or fatigue?  (e.g., corticosteroids, stimulants, nutritional supplements, alternative therapies)     N/a 8. CALLER's COPING: How are you doing? How are other family members and loved ones doing?     N/aL  Protocols used: Urinary Symptoms-A-AH, Hospice - Weakness (Generalized) and Fatigue-A-AH

## 2024-03-03 NOTE — Progress Notes (Unsigned)
 [facility] [address] [place] [address] Phone: [phone] Subjective:   Ethan Cruz, am serving as a scribe for Dr. Arthea Claudene.  I'm seeing this patient by the request  of:  Ethan Debby CROME, MD  CC: back pain  YEP:Dlagzrupcz  11/01/2021 OMT  Updated 03/04/2024 Ethan Cruz is a 28 y.o. male coming in with complaint of back pain. Back has been tender the last few days. Under shoulder blades a bit tender. Curvature of spine.       Past Medical History:  Diagnosis Date   ADHD (attention deficit hyperactivity disorder)    Allergic rhinitis, cause unspecified 09/19/2011   Asthma 09/19/2011   Auditory processing disorder    Depression    Dysgraphia    Glucose intolerance (pre-diabetes) 2011   Morbid obesity (HCC)    Past Surgical History:  Procedure Laterality Date   NO PAST SURGERIES     WISDOM TOOTH EXTRACTION     Social History   Socioeconomic History   Marital status: Single    Spouse name: Not on file   Number of children: Not on file   Years of education: Not on file   Highest education level: Not on file  Occupational History   Not on file  Tobacco Use   Smoking status: Never   Smokeless tobacco: Never  Substance and Sexual Activity   Alcohol use: No   Drug use: No   Sexual activity: Not Currently    Birth control/protection: Abstinence  Other Topics Concern   Not on file  Social History Narrative   Not on file   Social Drivers of Health   Financial Resource Strain: Not on file  Food Insecurity: Not on file  Transportation Needs: Not on file  Physical Activity: Not on file  Stress: Not on file  Social Connections: Unknown (10/23/2021)   Received from [facility]   Social Network    Social Network: Not on file   No Known Allergies Family History  Problem Relation Age of Onset   Diabetes Mother    Clotting disorder Mother 61       prothromb 2   Arthritis Mother    Arthritis Other         Parent & Grandparent   Ovarian cancer Other        grandmother   Heart disease Other        grandparent   Stroke Other        grandparent   Hypertension Other        grandparent   Diabetes Other        grandparent       Current Outpatient Medications (Analgesics):    meloxicam  (MOBIC ) 15 MG tablet, One tab PO qAM with breakfast for 2 weeks, then daily prn pain.   Current Outpatient Medications (Other):    Multiple Vitamins-Minerals (CENTRUM MEN PO), Take 1 tablet by mouth daily.   tiZANidine  (ZANAFLEX ) 4 MG tablet, TAKE 1 TABLET(4 MG) BY MOUTH three times a day AS NEEDED FOR MUSCLE SPASMS   Reviewed prior external information including notes and imaging from  primary care provider As well as notes that were available from care everywhere and other healthcare systems.  Past medical history, social, surgical and family history all reviewed in electronic medical record.  No pertanent information unless stated regarding to the chief complaint.   Review of Systems:  No headache, visual changes, nausea, vomiting, diarrhea, constipation, dizziness, abdominal pain, skin  rash, fevers, chills, night sweats, weight loss, swollen lymph nodes, body aches, joint swelling, chest pain, shortness of breath, mood changes. POSITIVE muscle aches  Objective  Blood pressure 114/80, pulse (!) 59, height 6' 2 (1.88 m), weight 196 lb (88.9 kg), SpO2 96%.   General: No apparent distress alert and oriented x3 mood and affect normal, dressed appropriately.  HEENT: Pupils equal, extraocular movements intact  Respiratory: Patient's speak in full sentences and does not appear short of breath  Cardiovascular: No lower extremity edema, non tender, no erythema  Mild scoliosis noted with left sided rotation and right sided sidebending.  Patient does have some scapular dyskinesis noted on the left side. No significant weakness noted.  [id number]; 15 additional minutes spent for Therapeutic exercises as stated in  above notes.  This included exercises focusing on stretching, strengthening, with significant focus on eccentric aspects.   Long term goals include an improvement in range of motion, strength, endurance as well as avoiding reinjury. Patient's frequency would include in 1-2 times a day, 3-5 times a week for a duration of 6-12 weeks. Shoulder Exercises that included:  Basic scapular stabilization to include adduction and depression of scapula Scaption, focusing on proper movement and good control Internal and External rotation utilizing a theraband, with elbow tucked at side entire time Rows with theraband   Proper technique shown and discussed handout in great detail with ATC.  All questions were discussed and answered.     Impression and Recommendations:    The above documentation has been reviewed and is accurate and complete Ethan Kendra M Nthony Lefferts, DO

## 2024-03-04 ENCOUNTER — Ambulatory Visit (INDEPENDENT_AMBULATORY_CARE_PROVIDER_SITE_OTHER)

## 2024-03-04 ENCOUNTER — Encounter: Payer: Self-pay | Admitting: Family Medicine

## 2024-03-04 ENCOUNTER — Ambulatory Visit (INDEPENDENT_AMBULATORY_CARE_PROVIDER_SITE_OTHER): Payer: Self-pay | Admitting: Family Medicine

## 2024-03-04 VITALS — BP 114/80 | HR 59 | Ht 74.0 in | Wt 196.0 lb

## 2024-03-04 DIAGNOSIS — G2589 Other specified extrapyramidal and movement disorders: Secondary | ICD-10-CM | POA: Diagnosis not present

## 2024-03-04 DIAGNOSIS — M546 Pain in thoracic spine: Secondary | ICD-10-CM

## 2024-03-04 NOTE — Assessment & Plan Note (Signed)
 Patient does have more of a scapular dyskinesis noted.  Discussed icing regimen and home exercises, discussed which activities to do and which ones to avoid.  Increase activity slowly.  Patient given the exercises and went over them with the athletic trainer.  If worsening symptoms will need to consider advanced imaging.  X-rays were further to evaluate for underlying scoliosis.

## 2024-03-04 NOTE — Patient Instructions (Addendum)
 Xrays today Do prescribed exercises at least 3x a week  See you again in 10 weeks

## 2024-05-12 NOTE — Progress Notes (Unsigned)
 [facility] [address] [place] [address] Phone: [phone] Subjective:   Ethan Cruz, am serving as a scribe for Dr. Arthea Claudene.  I'm seeing this patient by the request  of:  [name], MD  CC: Mid back pain follow-up  YEP:Dlagzrupcz  03/04/2024 Patient does have more of a scapular dyskinesis noted.  Discussed icing regimen and home exercises, discussed which activities to do and which ones to avoid.  Increase activity slowly.  Patient given the exercises and went over them with the athletic trainer.  If worsening symptoms will need to consider advanced imaging.  X-rays were further to evaluate for underlying scoliosis.     Updated 05/13/2024 Ethan Cruz is a 28 y.o. male coming in with complaint of thoracic spine pain. Patient states that he has been doing better. Would like adjustment.   When he wear tennis shoes his foot, knee and back tend to hurt. Wearing cowboy boots decreases.       Past Medical History:  Diagnosis Date   ADHD (attention deficit hyperactivity disorder)    Allergic rhinitis, cause unspecified 09/19/2011   Asthma 09/19/2011   Auditory processing disorder    Depression    Dysgraphia    Glucose intolerance (pre-diabetes) 2011   Morbid obesity (HCC)    Past Surgical History:  Procedure Laterality Date   NO PAST SURGERIES     WISDOM TOOTH EXTRACTION     Social History   Socioeconomic History   Marital status: Single    Spouse name: Not on file   Number of children: Not on file   Years of education: Not on file   Highest education level: Not on file  Occupational History   Not on file  Tobacco Use   Smoking status: Never   Smokeless tobacco: Never  Substance and Sexual Activity   Alcohol use: No   Drug use: No   Sexual activity: Not Currently    Birth control/protection: Abstinence  Other Topics Concern   Not on file  Social History Narrative   Not on file   Social Drivers of Health    Financial Resource Strain: Not on file  Food Insecurity: Not on file  Transportation Needs: Not on file  Physical Activity: Not on file  Stress: Not on file  Social Connections: Unknown (10/23/2021)   Received from [facility]   Social Network    Social Network: Not on file   No Known Allergies Family History  Problem Relation Age of Onset   Diabetes Mother    Clotting disorder Mother 48       prothromb 2   Arthritis Mother    Arthritis Other        Parent & Grandparent   Ovarian cancer Other        grandmother   Heart disease Other        grandparent   Stroke Other        grandparent   Hypertension Other        grandparent   Diabetes Other        grandparent       Current Outpatient Medications (Analgesics):    meloxicam  (MOBIC ) 15 MG tablet, One tab PO qAM with breakfast for 2 weeks, then daily prn pain.   Current Outpatient Medications (Other):    Multiple Vitamins-Minerals (CENTRUM MEN PO), Take 1 tablet by mouth daily.   tiZANidine  (ZANAFLEX ) 4 MG tablet, TAKE 1 TABLET(4 MG) BY MOUTH three times  a day AS NEEDED FOR MUSCLE SPASMS   Reviewed prior external information including notes and imaging from  primary care provider As well as notes that were available from care everywhere and other healthcare systems.  Past medical history, social, surgical and family history all reviewed in electronic medical record.  No pertanent information unless stated regarding to the chief complaint.   Review of Systems:  No headache, visual changes, nausea, vomiting, diarrhea, constipation, dizziness, abdominal pain, skin rash, fevers, chills, night sweats, weight loss, swollen lymph nodes, body aches, joint swelling, chest pain, shortness of breath, mood changes. POSITIVE muscle aches  Objective  Blood pressure 120/82, pulse 71, height 6' 2 (1.88 m), SpO2 97%.   General: No apparent distress alert and oriented x3 mood and affect normal, dressed appropriately.  HEENT:  Pupils equal, extraocular movements intact  Respiratory: Patient's speak in full sentences and does not appear short of breath  Cardiovascular: No lower extremity edema, non tender, no erythema  Back pain follow up does have kyphosis noted and some scoliosis.  Patient does have tenderness to palpation in the paraspinal musculature.   Osteopathic findings C2 flexed rotated and side bent right C5 flexed rotated and side bent left T3 extended rotated and side bent right inhaled third rib T7 extended rotated and side bent left L1 flexed rotated and side bent right Sacrum right on right    Impression and Recommendations:  Scapular dyskinesis Patient does have scapular dyskinesis.  Discussed the importance of continuing strengthening.  Increase activity slowly, continue to work on ergonomics throughout the day.  Follow-up again in 6 to 12 weeks.  Patient did respond well to osteopathic manipulation after further evaluation.     Decision today to treat with OMT was based on Physical Exam  After verbal consent patient was treated with HVLA, ME, FPR techniques in cervical, thoracic, rib, lumbar and sacral areas, all areas are chronic   Patient tolerated the procedure well with improvement in symptoms  Patient given exercises, stretches and lifestyle modifications  See medications in patient instructions if given  Patient will follow up in 4-8 weeks   The above documentation has been reviewed and is accurate and complete Danique Hartsough M Marsel Gail, DO

## 2024-05-13 ENCOUNTER — Encounter: Payer: Self-pay | Admitting: Family Medicine

## 2024-05-13 ENCOUNTER — Ambulatory Visit: Admitting: Family Medicine

## 2024-05-13 VITALS — BP 120/82 | HR 71 | Ht 74.0 in

## 2024-05-13 DIAGNOSIS — M9903 Segmental and somatic dysfunction of lumbar region: Secondary | ICD-10-CM

## 2024-05-13 DIAGNOSIS — G2589 Other specified extrapyramidal and movement disorders: Secondary | ICD-10-CM | POA: Diagnosis not present

## 2024-05-13 DIAGNOSIS — M9904 Segmental and somatic dysfunction of sacral region: Secondary | ICD-10-CM

## 2024-05-13 DIAGNOSIS — M9901 Segmental and somatic dysfunction of cervical region: Secondary | ICD-10-CM | POA: Diagnosis not present

## 2024-05-13 DIAGNOSIS — M9908 Segmental and somatic dysfunction of rib cage: Secondary | ICD-10-CM

## 2024-05-13 DIAGNOSIS — M9902 Segmental and somatic dysfunction of thoracic region: Secondary | ICD-10-CM

## 2024-05-13 NOTE — Patient Instructions (Signed)
 Vibrum bottom for shoes See me in 2 months

## 2024-05-13 NOTE — Assessment & Plan Note (Signed)
 Patient does have scapular dyskinesis.  Discussed the importance of continuing strengthening.  Increase activity slowly, continue to work on ergonomics throughout the day.  Follow-up again in 6 to 12 weeks.  Patient did respond well to osteopathic manipulation after further evaluation.

## 2024-06-13 ENCOUNTER — Other Ambulatory Visit: Payer: Self-pay

## 2024-06-13 ENCOUNTER — Emergency Department (HOSPITAL_BASED_OUTPATIENT_CLINIC_OR_DEPARTMENT_OTHER)
Admission: EM | Admit: 2024-06-13 | Discharge: 2024-06-13 | Disposition: A | Discharge status: Home / Self Care | Attending: Emergency Medicine | Admitting: Emergency Medicine

## 2024-06-13 DIAGNOSIS — M5442 Lumbago with sciatica, left side: Secondary | ICD-10-CM | POA: Insufficient documentation

## 2024-06-13 DIAGNOSIS — M5432 Sciatica, left side: Secondary | ICD-10-CM

## 2024-06-13 LAB — URINALYSIS, ROUTINE W REFLEX MICROSCOPIC
Bilirubin Urine: NEGATIVE
Glucose, UA: NEGATIVE mg/dL
Hgb urine dipstick: NEGATIVE
Ketones, ur: NEGATIVE mg/dL
Leukocytes,Ua: NEGATIVE
Nitrite: NEGATIVE
Protein, ur: NEGATIVE mg/dL
Specific Gravity, Urine: 1.01 (ref 1.005–1.030)
pH: 6.5 (ref 5.0–8.0)

## 2024-06-13 MED ORDER — OXYCODONE HCL 5 MG PO TABS
5.0000 mg | ORAL_TABLET | Freq: Once | ORAL | Status: AC
  Administered 2024-06-13: 5 mg via ORAL
  Filled 2024-06-13: qty 1

## 2024-06-13 MED ORDER — ACETAMINOPHEN 325 MG PO TABS: 650.0000 mg | ORAL_TABLET | Freq: Once | ORAL | Status: DC

## 2024-06-13 MED ORDER — LIDOCAINE 5 % EX PTCH: 1.0000 | MEDICATED_PATCH | CUTANEOUS | 0 refills | Status: DC

## 2024-06-13 MED ORDER — DIAZEPAM 2 MG PO TABS
2.0000 mg | ORAL_TABLET | Freq: Once | ORAL | Status: AC
  Administered 2024-06-13: 2 mg via ORAL
  Filled 2024-06-13: qty 1

## 2024-06-13 MED ORDER — PREDNISONE 10 MG (21) PO TBPK: ORAL_TABLET | Freq: Every day | ORAL | 0 refills | Status: DC

## 2024-06-13 MED ORDER — METHYLPREDNISOLONE SODIUM SUCC 125 MG IJ SOLR
125.0000 mg | Freq: Once | INTRAMUSCULAR | Status: AC
  Administered 2024-06-13: 125 mg via INTRAMUSCULAR
  Filled 2024-06-13: qty 2

## 2024-06-13 MED ORDER — PREDNISONE 10 MG (21) PO TBPK: ORAL_TABLET | Freq: Every day | ORAL | 0 refills | Status: AC

## 2024-06-13 MED ORDER — LIDOCAINE 5 % EX PTCH: 1.0000 | MEDICATED_PATCH | CUTANEOUS | 0 refills | Status: AC

## 2024-06-13 MED ORDER — LIDOCAINE 5 % EX PTCH
1.0000 | MEDICATED_PATCH | CUTANEOUS | Status: DC
  Administered 2024-06-13: 1 via TRANSDERMAL
  Filled 2024-06-13: qty 1

## 2024-06-13 MED ORDER — ACETAMINOPHEN 500 MG PO TABS
1000.0000 mg | ORAL_TABLET | Freq: Once | ORAL | Status: AC
  Administered 2024-06-13: 1000 mg via ORAL
  Filled 2024-06-13: qty 2

## 2024-06-13 MED ORDER — OXYCODONE-ACETAMINOPHEN 5-325 MG PO TABS: 1.0000 | ORAL_TABLET | Freq: Once | ORAL | Status: DC

## 2024-06-13 MED ORDER — METHYLPREDNISOLONE SODIUM SUCC 40 MG IJ SOLR: 40.0000 mg | Freq: Once | INTRAMUSCULAR | Status: DC

## 2024-06-13 MED ORDER — KETOROLAC TROMETHAMINE 30 MG/ML IJ SOLN: 30.0000 mg | Freq: Once | INTRAMUSCULAR | Status: DC

## 2024-06-13 NOTE — ED Provider Notes (Signed)
 Patient seen in conjunction with Aanchal PA.  See their note for full details.  In short patient presents with back pain that has been ongoing for the past 10 days.,  And URI symptoms that started today.  Given the shortage of flu test and prevalence of flu in the community will empirically treat with Tamiflu.  After multimodal pain control he does have improvement.  His straight leg raise test was positive bilaterally but worse on the left.  Will treat for sciatica.  No red flag signs or symptoms concerning for cauda equina syndrome or spinal epidural abscess.  Patient is in agreement with plan.  Return precautions discussed   Hildegard Loge, PA-C 06/13/24 1158    Dreama Longs, MD 06/17/24 [phone]

## 2024-06-13 NOTE — ED Triage Notes (Signed)
 Patient states 12/26 tried to stand up from couch and collapsed on floor due to back pain. Pain has since been primarily on left side. Also endorses congestion, headache, and cough since this morning. Patient states dark urine and urinary frequency. Denies urinary incontinence.

## 2024-06-13 NOTE — Discharge Instructions (Addendum)
 Take steroids as prescribed.  She can take ibuprofen  or Tylenol  for pain as needed.  Apply warm compress to lower back for 20 minutes.  Apply lidocaine  patch.  Follow-up with primary care doctor for further evaluation.  For worsening back pain, loss of bowel or urine, vomiting, loss, fevers, increasing weakness, shortness of breath.

## 2024-06-13 NOTE — ED Provider Notes (Signed)
 " [place] EMERGENCY DEPARTMENT AT [facility] Provider Note   CSN: [id number] Arrival date & time: 06/13/24  0941     Patient presents with: Back Pain   Ethan Cruz is a 29 y.o. male.  presents to the ED with left lower back pain that started 8 days ago.  Denies injuries.  Patient notes that he felt immediate lower back pain after standing up and collapsed to the floor.  He did not pass out or hit his head.  Patient did have difficulty getting up due to pain.  Patient notes that the pain radiates to left leg. He states the pain has been constant since then and worse with movement.  He rates the pain a 8 out of 10 at this time. Patient took ibuprofen  and Tylenol  with minimal relief.  Patient also has a cough that started this morning.  He also endorses increased urinary frequency.  History of cancer.  No history of drug use.  Denies fevers, urinary incontinence,, abdominal pain, diarrhea, vomiting, shortness of breath, chills, weight loss, chest pain, loss of bowel or urine, or any other symptoms at this time.     Back Pain      Prior to Admission medications  Medication Sig Start Date End Date Taking? Authorizing Provider  lidocaine  (LIDODERM ) 5 % Place 1 patch onto the skin daily. Remove & Discard patch within 12 hours or as directed by MD 06/13/24  Yes Braxton, Hasini Peachey, PA-C  predniSONE  (STERAPRED UNI-PAK 21 TAB) 10 MG (21) TBPK tablet Take by mouth daily. Take 6 tabs by mouth daily  for 2 days, then 5 tabs for 2 days, then 4 tabs for 2 days, then 3 tabs for 2 days, 2 tabs for 2 days, then 1 tab by mouth daily for 2 days 06/13/24  Yes Dakhari Zuver, PA-C  meloxicam  (MOBIC ) 15 MG tablet One tab PO qAM with breakfast for 2 weeks, then daily prn pain. 09/23/22   Corey, Evan S, MD  Multiple Vitamins-Minerals (CENTRUM MEN PO) Take 1 tablet by mouth daily.    [provider]  tiZANidine  (ZANAFLEX ) 4 MG tablet TAKE 1 TABLET(4 MG) BY MOUTH three times a day AS NEEDED FOR MUSCLE SPASMS  09/23/22   Joane Artist RAMAN, MD    Allergies: Patient has no known allergies.    Review of Systems  Musculoskeletal:  Positive for back pain.    Updated Vital Signs BP 122/87   Pulse 97   Temp 98.7 F (37.1 C) (Oral)   Resp 16   SpO2 100%   Physical Exam Vitals and nursing note reviewed.  Constitutional:      General: He is not in acute distress.    Appearance: Normal appearance. He is well-developed.  HENT:     Head: Normocephalic and atraumatic.  Eyes:     Conjunctiva/sclera: Conjunctivae normal.  Cardiovascular:     Rate and Rhythm: Normal rate and regular rhythm.     Heart sounds: No murmur heard. Pulmonary:     Effort: Pulmonary effort is normal. No respiratory distress.     Breath sounds: Normal breath sounds.  Abdominal:     Palpations: Abdomen is soft.     Tenderness: There is no abdominal tenderness.  Musculoskeletal:        General: No swelling.     Cervical back: Normal and neck supple.     Thoracic back: Normal.     Lumbar back: Tenderness present. No swelling, deformity, signs of trauma or lacerations. Decreased range  of motion. Positive right straight leg raise test and positive left straight leg raise test. No scoliosis.     Right upper leg: Normal.     Left upper leg: Normal.     Right knee: Normal.     Left knee: Normal.     Right ankle: Normal.     Left ankle: Normal.     Right foot: Normal.     Left foot: Normal.     Comments: Tenderness to left lower lumbar, pain with flexion.  Muscle strength 5 out of 5.  Positive right and left straight leg raise, worse on the left side.  No midline tenderness.  Skin:    General: Skin is warm and dry.     Capillary Refill: Capillary refill takes less than 2 seconds.  Neurological:     Mental Status: He is alert and oriented to person, place, and time.     Sensory: Sensation is intact.  Psychiatric:        Mood and Affect: Mood normal.     (all labs ordered are listed, but only abnormal results are  displayed) Labs Reviewed  URINALYSIS, ROUTINE W REFLEX MICROSCOPIC - Abnormal; Notable for the following components:      Result Value   Color, Urine STRAW (*)    All other components within normal limits    EKG: None  Radiology: No results found.   Procedures   Medications Ordered in the ED  lidocaine  (LIDODERM ) 5 % 1 patch (1 patch Transdermal Patch Applied 06/13/24 1046)  diazepam  (VALIUM ) tablet 2 mg (2 mg Oral Given 06/13/24 1046)  acetaminophen  (TYLENOL ) tablet 1,000 mg (1,000 mg Oral Given 06/13/24 1046)  oxyCODONE  (Oxy IR/ROXICODONE ) immediate release tablet 5 mg (5 mg Oral Given 06/13/24 1049)  methylPREDNISolone  sodium succinate (SOLU-MEDROL ) 125 mg/2 mL injection 125 mg (125 mg Intramuscular Given 06/13/24 1047)                                    Medical Decision Making Amount and/or Complexity of Data Reviewed Labs: ordered.  Risk OTC drugs. Prescription drug management.    Dispostion: Patient presents with left low back pain that radiates to the leg.  No injuries or falls.  On exam, he is alert and oriented with no distress.  Vital signs stable.  Positive straight leg rise on both sides but worse on left side.  Range of motion worse with flexion but otherwise normal.  No midline tenderness.  Tenderness to palpation to left lower lumbar.  No signs of cauda equina or spinal abscess.  No saddle anesthesia.  Patient was provided oxycodone , Solu-Medrol , Tylenol , lidocaine  patch, Valium  for his pain.  Upon reevaluation, patient reported improvement in his symptoms.  Normal urinalysis with no signs of infection.  I do not believe imaging is necessary at this time.  I suspect this is most likely sciatica or lumbar radiculopathy.  Will prescribe steroids and lidocaine  for pain.  Recommended patient to take ibuprofen  and Tylenol  as needed.  I recommended patient to follow-up with PCP for further evaluation.  Patient is in agreement with plan and is stable for discharge.        Final diagnoses:  Sciatica of left side    ED Discharge Orders          Ordered    lidocaine  (LIDODERM ) 5 %  Every 24 hours        06/13/24 1154  predniSONE  (STERAPRED UNI-PAK 21 TAB) 10 MG (21) TBPK tablet  Daily        06/13/24 1154               Braxton Dubois, PA-C 06/13/24 1253    Dreama Longs, MD 06/17/24 [phone]  "

## 2024-07-14 NOTE — Progress Notes (Unsigned)
 " [facility] [address] [place] [address] Phone: [phone] Subjective:   Ethan Cruz am a scribe for Dr. Claudene.   I'm seeing this patient by the request  of:  [name], MD  CC: Neck and back pain follow-up  YEP:Dlagzrupcz  Ethan Cruz is a 29 y.o. male coming in with complaint of back and neck pain. OMT on 05/13/2024. Patient states that the back and neck are good now. At Christmas he had a bad episode. Lower right side. He could not stand or sit up. Thought he would have to go to ER. Couldn't lift anything. It lasted about a week. Went to work with a limp that week. Ran a fever that next weekend. Sunday the next day the pain increased severely and went to the ER at [facility]. Got a shot and they did some testing of the urine. Had a two week dose of prednisone  and he has not had the pain like it was at Christmas.   Medications patient has been prescribed:   Taking:         Reviewed prior external information including notes and imaging from previsou exam, outside providers and external EMR if available.   As well as notes that were available from care everywhere and other healthcare systems.  Past medical history, social, surgical and family history all reviewed in electronic medical record.  No pertanent information unless stated regarding to the chief complaint.   Past Medical History:  Diagnosis Date   ADHD (attention deficit hyperactivity disorder)    Allergic rhinitis, cause unspecified 09/19/2011   Asthma 09/19/2011   Auditory processing disorder    Depression    Dysgraphia    Glucose intolerance (pre-diabetes) 2011   Morbid obesity (HCC)     Allergies[1]   Review of Systems:  No headache, visual changes, nausea, vomiting, diarrhea, constipation, dizziness, abdominal pain, skin rash, fevers, chills, night sweats, weight loss, swollen lymph nodes, body aches, joint swelling, chest pain, shortness of breath, mood  changes. POSITIVE muscle aches  Objective  Blood pressure 124/70, pulse 75, height 6' 2 (1.88 m), weight 194 lb (88 kg), SpO2 97%.   General: No apparent distress alert and oriented x3 mood and affect normal, dressed appropriately.  HEENT: Pupils equal, extraocular movements intact  Respiratory: Patient's speak in full sentences and does not appear short of breath  Cardiovascular: No lower extremity edema, non tender, no erythema  Gait MSK:  Back does have some very mild loss of lordosis.  Patient has increasing kyphosis of the upper back.  Patient does have some scapular dyskinesis right than left with some scoliosis noted.  Left sided lower back still moderately tender to palpation full strength of the lower extremities.  Osteopathic findings  C2 flexed rotated and side bent right C6 flexed rotated and side bent left T3 extended rotated and side bent right inhaled rib T9 extended rotated and side bent left L2 flexed rotated and side bent right L5 flexed rotated and side bent left Sacrum left on left       Assessment and Plan:  Acute left-sided low back pain with left-sided sciatica Had an acute exacerbation, has had this difficulty intermittently over the years.  We discussed with patient about which activities to do and which ones to avoid.  Discussed this continuing to give us  difficulty advanced imaging would be warranted.  Follow-up again in 6 to 12 weeks    Nonallopathic problems  Decision today to  treat with OMT was based on Physical Exam  After verbal consent patient was treated with HVLA, ME, FPR techniques in cervical, rib, thoracic, lumbar, and sacral  areas  Patient tolerated the procedure well with improvement in symptoms  Patient given exercises, stretches and lifestyle modifications  See medications in patient instructions if given  Patient will follow up in 4-8 weeks    The above documentation has been reviewed and is accurate and complete Rosilyn Coachman M  Castle Lamons, DO          Note: This dictation was prepared with Dragon dictation along with smaller phrase technology. Any transcriptional errors that result from this process are unintentional.            [1] No Known Allergies  "

## 2024-07-15 ENCOUNTER — Ambulatory Visit: Admitting: Family Medicine

## 2024-07-15 VITALS — BP 124/70 | HR 75 | Ht 74.0 in | Wt 194.0 lb

## 2024-07-15 DIAGNOSIS — M9901 Segmental and somatic dysfunction of cervical region: Secondary | ICD-10-CM

## 2024-07-15 DIAGNOSIS — M5442 Lumbago with sciatica, left side: Secondary | ICD-10-CM

## 2024-07-15 DIAGNOSIS — M9908 Segmental and somatic dysfunction of rib cage: Secondary | ICD-10-CM

## 2024-07-15 DIAGNOSIS — M9902 Segmental and somatic dysfunction of thoracic region: Secondary | ICD-10-CM

## 2024-07-15 DIAGNOSIS — M9904 Segmental and somatic dysfunction of sacral region: Secondary | ICD-10-CM

## 2024-07-15 DIAGNOSIS — M9903 Segmental and somatic dysfunction of lumbar region: Secondary | ICD-10-CM

## 2024-07-15 NOTE — Patient Instructions (Signed)
 See me again in 8 weeks

## 2024-07-16 ENCOUNTER — Encounter: Payer: Self-pay | Admitting: Family Medicine

## 2024-07-16 NOTE — Assessment & Plan Note (Signed)
 Had an acute exacerbation, has had this difficulty intermittently over the years.  We discussed with patient about which activities to do and which ones to avoid.  Discussed this continuing to give us  difficulty advanced imaging would be warranted.  Follow-up again in 6 to 12 weeks

## 2024-09-16 ENCOUNTER — Ambulatory Visit: Admitting: Family Medicine
